# Patient Record
Sex: Female | Born: 1990 | Race: White | Hispanic: No | Marital: Single | Smoking: Former smoker
Health system: Southern US, Community
[De-identification: ages and names within clinical notes are randomized; demographics above are authoritative.]

## PROBLEM LIST (undated history)

## (undated) DIAGNOSIS — F99 Mental disorder, not otherwise specified: Secondary | ICD-10-CM

## (undated) DIAGNOSIS — Q600 Renal agenesis, unilateral: Secondary | ICD-10-CM

## (undated) DIAGNOSIS — L0292 Furuncle, unspecified: Secondary | ICD-10-CM

## (undated) DIAGNOSIS — O24419 Gestational diabetes mellitus in pregnancy, unspecified control: Secondary | ICD-10-CM

## (undated) DIAGNOSIS — R4586 Emotional lability: Secondary | ICD-10-CM

## (undated) DIAGNOSIS — N750 Cyst of Bartholin's gland: Secondary | ICD-10-CM

## (undated) HISTORY — PX: ADENOIDECTOMY: SUR15

## (undated) HISTORY — DX: Gestational diabetes mellitus in pregnancy, unspecified control: O24.419

## (undated) HISTORY — DX: Mental disorder, not otherwise specified: F99

## (undated) HISTORY — PX: OTHER SURGICAL HISTORY: SHX169

## (undated) HISTORY — DX: Renal agenesis, unilateral: Q60.0

## (undated) HISTORY — DX: Cyst of Bartholin's gland: N75.0

## (undated) HISTORY — PX: NEPHRECTOMY: SHX65

---
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
## 2009-05-04 ENCOUNTER — Emergency Department (HOSPITAL_COMMUNITY): Admission: EM | Admit: 2009-05-04 | Discharge: 2009-05-04 | Payer: Self-pay | Admitting: Emergency Medicine

## 2010-04-22 LAB — POCT I-STAT, CHEM 8
BUN: 3 mg/dL — ABNORMAL LOW (ref 6–23)
Chloride: 106 mEq/L (ref 96–112)
Creatinine, Ser: 0.7 mg/dL (ref 0.4–1.2)
Glucose, Bld: 89 mg/dL (ref 70–99)
Hemoglobin: 15 g/dL (ref 12.0–15.0)
Potassium: 3.5 mEq/L (ref 3.5–5.1)
Sodium: 142 mEq/L (ref 135–145)

## 2010-04-22 LAB — URINALYSIS, ROUTINE W REFLEX MICROSCOPIC
Ketones, ur: 15 mg/dL — AB
Nitrite: NEGATIVE
Protein, ur: 30 mg/dL — AB
Urobilinogen, UA: 1 mg/dL (ref 0.0–1.0)

## 2010-04-22 LAB — URINE MICROSCOPIC-ADD ON

## 2010-05-19 ENCOUNTER — Inpatient Hospital Stay (HOSPITAL_COMMUNITY)
Admission: AD | Admit: 2010-05-19 | Discharge: 2010-05-19 | Disposition: A | Discharge status: Home / Self Care | Payer: Self-pay | Source: Ambulatory Visit | Attending: Obstetrics & Gynecology | Admitting: Obstetrics & Gynecology

## 2010-05-19 DIAGNOSIS — N949 Unspecified condition associated with female genital organs and menstrual cycle: Secondary | ICD-10-CM

## 2010-05-19 DIAGNOSIS — N751 Abscess of Bartholin's gland: Secondary | ICD-10-CM | POA: Insufficient documentation

## 2010-05-22 LAB — CULTURE, ROUTINE-ABSCESS

## 2010-06-04 ENCOUNTER — Encounter: Payer: Self-pay | Admitting: Obstetrics and Gynecology

## 2010-06-19 NOTE — Op Note (Signed)
[place]. [facility]  Patient:    Emily David, Emily David                         MRN: [id number] Proc. Date: 10/02/99 Adm. Date:  [id number] Attending:  Carlean Purl CC:         Jamesetta Geralds, M.D.                           Operative Report  PREOPERATIVE DIAGNOSES: 1. Adenoid/tonsillar hypertrophy with obstructive symptoms. 2. Left mucoid otitis media.  POSTOPERATIVE DIAGNOSES: 1. Adenoid/tonsillar hypertrophy with obstructive symptoms. 2. Left mucoid otitis media.  OPERATION:  Tonsillectomy and adenoidectomy.  Bilateral myringotomies.  SURGEON:  Kristine Garbe. Ezzard Standing, M.D.  ANESTHESIA:  General.  COMPLICATIONS:  None.  BRIEF CLINICAL NOTE:  Emily David is a 20 year old who has had longstanding problems with snoring and obstructive breathing pattern at night.  On examination she has large 3+ tonsils, as well as large obstructing adenoid tissue.  In addition on examination in the office she had middle ear effusion on the left side.  She is taken to the operating room at this time for tonsillectomy and adenoidectomy, as well as possible myringotomy.  DESCRIPTION OF PROCEDURE:  After adequate endotracheal anesthesia, the ears were examined first.  The right TM appeared clear.  A small myringotomy was made in the inferior portion of the TM and middle ear space was dry.  On the left side a myringotomy was made in the anterior and inferior portion of the TM; a large amount of thick, mucoid fluid was aspirated from the left middle ear space.  Pediatric drops were place in the left ear canal.  This completed the ear examination and myringotomy.  Next, a mouth gag was used to expose the oropharynx.  The left and right tonsils were dissected from the tonsillar pocket using cautery.  Hemostasis was with hemicautery.  Care was taken to preserve the anterior and posterior tonsillar cove as well as the uvula.  Following this, a red rubber catheter was  passed through the nose and out the mouth to retract soft palate; nasopharynx was examined.  ______ had large obstructing adenoid tissue.  A large adenoid curet was used to remove the central pad of adenoid tissue. The nasopharyngeal packs were placed for hemostasis; these were then removed and further hemostasis was obtained with suction cautery.  After obtaining adequate hemostasis, the procedure was completed.  Nasopharynx was irrigated with saline.  Emily David was wakened from anesthesia and transferred to the recovery room; postoperatively doing well.  Of note, she received 8 mg of Decadron IV preoperatively, as well as 1 g Ancef preoperatively.  DISPOSITION:  Emily David is discharged home later this morning.  Amoxicillin suspension 400 mg b.i.d. for one week, along with Tylenol and Lortab elixir 1-2 teaspoons q.4h. p.r.n. pain.  She is to follow up in my office in two weeks for recheck. DD:  10/02/99 TD:  10/02/99 Job: [id number]

## 2011-01-09 ENCOUNTER — Emergency Department (HOSPITAL_BASED_OUTPATIENT_CLINIC_OR_DEPARTMENT_OTHER)
Admission: EM | Admit: 2011-01-09 | Discharge: 2011-01-09 | Disposition: A | Discharge status: Home / Self Care | Payer: Self-pay | Attending: Emergency Medicine | Admitting: Emergency Medicine

## 2011-01-09 ENCOUNTER — Encounter: Payer: Self-pay | Admitting: Emergency Medicine

## 2011-01-09 DIAGNOSIS — N751 Abscess of Bartholin's gland: Secondary | ICD-10-CM | POA: Insufficient documentation

## 2011-01-09 MED ORDER — MORPHINE SULFATE 2 MG/ML IJ SOLN
INTRAMUSCULAR | Status: AC
  Administered 2011-01-09: 2 mg via INTRAMUSCULAR
  Filled 2011-01-09: qty 1

## 2011-01-09 MED ORDER — HYDROCODONE-ACETAMINOPHEN 5-325 MG PO TABS: 1.0000 | ORAL_TABLET | ORAL | Status: AC | PRN

## 2011-01-09 MED ORDER — MORPHINE SULFATE 4 MG/ML IJ SOLN
4.0000 mg | Freq: Once | INTRAMUSCULAR | Status: AC
  Administered 2011-01-09: 4 mg via INTRAMUSCULAR

## 2011-01-09 MED ORDER — MORPHINE SULFATE 2 MG/ML IJ SOLN
2.0000 mg | Freq: Once | INTRAMUSCULAR | Status: AC
  Administered 2011-01-09: 2 mg via INTRAMUSCULAR

## 2011-01-09 MED ORDER — MORPHINE SULFATE 4 MG/ML IJ SOLN
6.0000 mg | Freq: Once | INTRAMUSCULAR | Status: DC
  Filled 2011-01-09: qty 1

## 2011-01-09 MED ORDER — OXYCODONE-ACETAMINOPHEN 5-325 MG PO TABS
1.0000 | ORAL_TABLET | Freq: Once | ORAL | Status: AC
  Administered 2011-01-09: 1 via ORAL
  Filled 2011-01-09: qty 1

## 2011-01-09 MED ORDER — CEPHALEXIN 500 MG PO CAPS: 500.0000 mg | ORAL_CAPSULE | Freq: Four times a day (QID) | ORAL | Status: AC

## 2011-01-09 NOTE — ED Notes (Addendum)
Abcess to right labia came about 4 days ago, painful x 1 day, pain worse with movement

## 2011-01-09 NOTE — ED Notes (Signed)
I & D tray has been placed at bedside.

## 2011-01-09 NOTE — ED Notes (Signed)
The patient is undress from the waist down. The bed is lock and in the lowest position. The call light is within reach, and a family member is sitting with the patient.

## 2011-01-09 NOTE — ED Notes (Signed)
Assisted Dr. Golda Acre with I & D.  Pt had large amount of pus and blood from incision.  Pt tolerated well.  Packing in place.  Peri-care offered.

## 2011-01-09 NOTE — ED Provider Notes (Signed)
History     CSN: [id number] Arrival date & time: 01/09/2011 11:32 AM   First MD Initiated Contact with Patient 01/09/11 1201      Chief Complaint  Patient presents with  . Recurrent Skin Infections  . Bartholin's Cyst    (Consider location/radiation/quality/duration/timing/severity/associated sxs/prior treatment) HPI Comments: Patient complains of a few days of increased swelling in her right lower vagina and labia region.  She has had a abscess there before which he did have drained.  She denies any fevers.  No nausea or vomiting or abdominal pain.  No dysuria or other vaginal discharge.  Patient has not been able to control her pain at home with over-the-counter pain medicines.  Patient is a 20 y.o. female presenting with abscess. The history is provided by the patient.  Abscess  Pertinent negatives include no fever, no diarrhea, no vomiting and no cough.    Past Medical History  Diagnosis Date  . Asthma     Past Surgical History  Procedure Date  . 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 No pertinent family history.  History  Substance Use Topics  . Smoking status: Current Everyday Smoker -- 0.5 packs/day for 1 years    Types: Cigarettes  . Smokeless tobacco: Not on file  . Alcohol Use: Yes     occausional    OB History    Grav Para Term Preterm Abortions TAB SAB Ect Mult Living                  Review of Systems  Constitutional: Negative.  Negative for fever and chills.  HENT: Negative.   Eyes: Negative.  Negative for discharge and redness.  Respiratory: Negative.  Negative for cough and shortness of breath.   Cardiovascular: Negative.  Negative for chest pain.  Gastrointestinal: Negative.  Negative for nausea, vomiting, abdominal pain and diarrhea.  Genitourinary: Positive for vaginal pain. Negative for dysuria and vaginal discharge.  Musculoskeletal: Negative.  Negative for back pain.  Skin: Negative.  Negative for color change and rash.    Neurological: Negative.  Negative for syncope and headaches.  Hematological: Negative.  Negative for adenopathy.  Psychiatric/Behavioral: Negative.  Negative for confusion.  All other systems reviewed and are negative.    Allergies  Review of patient's allergies indicates no known allergies.  Home Medications  No current outpatient prescriptions on file.  BP 150/90  Pulse 86  Temp(Src) 98.1 F (36.7 C) (Oral)  Resp 20  Ht 5\' 7"  (1.702 m)  Wt 230 lb (104.327 kg)  BMI 36.02 kg/m2  SpO2 100%  LMP 12/26/2010  Physical Exam  Constitutional: She is oriented to person, place, and time. She appears well-developed and well-nourished.  HENT:  Head: Normocephalic and atraumatic.  Eyes: Conjunctivae and EOM are normal. Pupils are equal, round, and reactive to light.  Neck: Normal range of motion. Neck supple.  Cardiovascular: Normal rate and regular rhythm.   Pulmonary/Chest: Effort normal and breath sounds normal.  Abdominal: Soft. Bowel sounds are normal. There is no tenderness.  Genitourinary:       Asencion Islam was present as a chaperone during procedure.  Patient had a right Bartholin's gland abscess.  There were 2 areas of fluctuance and swelling present no crepitus.  No significant cellulitis to the labia.  No extension to the rectum  Musculoskeletal: Normal range of motion.  Neurological: She is alert and oriented to person, place, and time.  Skin: Skin is warm and dry. No rash noted.  No erythema.  Psychiatric: She has a normal mood and affect. Her behavior is normal. Judgment and thought content normal.    ED Course  INCISION AND DRAINAGE Date/Time: 01/09/2011 1:50 PM Performed by: Emeline General A Authorized by: Emeline General A Consent: Verbal consent obtained. Written consent not obtained. Risks and benefits: risks, benefits and alternatives were discussed Consent given by: patient Patient understanding: patient states understanding of the procedure being  performed Patient identity confirmed: verbally with patient Type: abscess Location: Right Bartholin's gland abscess. Anesthesia: local infiltration Local anesthetic: lidocaine 2% with epinephrine Anesthetic total: 5 ml Patient sedated: no Scalpel size: 11 Complexity: simple Drainage: purulent Drainage amount: copious Wound treatment: drain placed Packing material: 1/2 in gauze Patient tolerance: Patient tolerated the procedure well with no immediate complications. Comments: Patient had an initial incision done marked anteriorly which did not get any pus but relieved a small area of fluctuance.  More posteriorly she had a large area of fluctuance which a 1 cm incision was placed in and did drink copious amounts of pus.  Fluctuance is now resolved.  A packing was placed as patient did not want to have another words catheter placed that caused her significant discomfort previously.   (including critical care time)  Labs Reviewed - No data to display No results found.   No diagnosis found.    MDM  Patient with Bartholin's gland abscess.  He was able to be successfully drained here patient and already feels significantly better.  Patient's been advised that she should follow up long-term with gynecology since this is the second time that this occurred to her.  [facility] does not have a gynecologist at this time.  I will place the patient on antibiotics and she's been instructed regarding soaks and only using soap and water without any fragrances or other consults.        Nat Christen, MD 01/09/11 1351

## 2011-05-26 ENCOUNTER — Encounter (HOSPITAL_COMMUNITY): Payer: Self-pay | Admitting: Emergency Medicine

## 2011-05-26 ENCOUNTER — Emergency Department (HOSPITAL_COMMUNITY)
Admission: EM | Admit: 2011-05-26 | Discharge: 2011-05-26 | Disposition: A | Discharge status: Home / Self Care | Payer: Self-pay | Attending: Emergency Medicine | Admitting: Emergency Medicine

## 2011-05-26 DIAGNOSIS — X58XXXA Exposure to other specified factors, initial encounter: Secondary | ICD-10-CM | POA: Insufficient documentation

## 2011-05-26 DIAGNOSIS — S025XXA Fracture of tooth (traumatic), initial encounter for closed fracture: Secondary | ICD-10-CM | POA: Insufficient documentation

## 2011-05-26 DIAGNOSIS — F17200 Nicotine dependence, unspecified, uncomplicated: Secondary | ICD-10-CM | POA: Insufficient documentation

## 2011-05-26 DIAGNOSIS — K029 Dental caries, unspecified: Secondary | ICD-10-CM | POA: Insufficient documentation

## 2011-05-26 DIAGNOSIS — K0889 Other specified disorders of teeth and supporting structures: Secondary | ICD-10-CM

## 2011-05-26 HISTORY — DX: Emotional lability: R45.86

## 2011-05-26 MED ORDER — HYDROCODONE-ACETAMINOPHEN 5-500 MG PO TABS: 1.0000 | ORAL_TABLET | Freq: Four times a day (QID) | ORAL | Status: AC | PRN

## 2011-05-26 MED ORDER — PENICILLIN V POTASSIUM 500 MG PO TABS: 500.0000 mg | ORAL_TABLET | Freq: Three times a day (TID) | ORAL | Status: AC

## 2011-05-26 NOTE — ED Provider Notes (Signed)
History     CSN: [id number]  Arrival date & time 05/26/11  4098   First MD Initiated Contact with Patient 05/26/11 1000      Chief Complaint  Patient presents with  . Dental Pain    (Consider location/radiation/quality/duration/timing/severity/associated sxs/prior treatment) HPI  21 year old female presents with chief complaints of dental pain. Patient states for the past week she has been having pain to her right upper molar. Onset was gradual, the pain is intermittent but getting progressively worse. She described pain as a throbbing sensation that radiates up to her right face. Pain is worsening with hot or cold liquid. States pain does not bother her teeth. She has been taking ibuprofen for pain. Patient takes 600 mg every 3 hours which has helped she knows she is taking too much. She denies fever, hearing changes, rash, sore throat, pain with jaw movement, or neck pain.   Past Medical History  Diagnosis Date  . Asthma   . Mood swings     Past Surgical History  Procedure Date  . 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  History  Substance Use Topics  . Smoking status: Current Everyday Smoker -- 0.5 packs/day for 1 years    Types: Cigarettes  . Smokeless tobacco: Not on file  . Alcohol Use: Yes     occausional    OB History    Grav Para Term Preterm Abortions TAB SAB Ect Mult Living                  Review of Systems  All other systems reviewed and are negative.    Allergies  Review of patient's allergies indicates no known allergies.  Home Medications   Current Outpatient Rx  Name Route Sig Dispense Refill  . FLUOXETINE HCL 20 MG PO CAPS Oral Take 20 mg by mouth daily.    . IBUPROFEN 200 MG PO TABS Oral Take 600 mg by mouth every 6 (six) hours as needed. For pain      BP 138/97  Pulse 67  Temp(Src) 98.2 F (36.8 C) (Oral)  Resp 16  SpO2 100%  LMP 04/25/2011  Physical Exam  Nursing note and vitals reviewed. Constitutional:  She appears well-developed and well-nourished. No distress.  HENT:  Head: Normocephalic and atraumatic.  Right Ear: External ear normal.  Left Ear: External ear normal.  Mouth/Throat: Oropharynx is clear and moist. No oropharyngeal exudate.         No evidence of TMJ. No ear involvement. No postauricular lymphadenopathy or rash  Eyes: Conjunctivae are normal.  Neck: Normal range of motion. Neck supple.  Musculoskeletal: Normal range of motion.  Lymphadenopathy:    She has no cervical adenopathy.  Neurological: She is alert.  Skin: Skin is warm. No rash noted.    ED Course  Procedures (including critical care time)  Labs Reviewed - No data to display No results found.   No diagnosis found.    MDM  Dental pain to R upper molar with decay and avulsion noted.  No abscess.  ABX and pain medication prescribed.  Recommend not to exceed recommended dosage for ibuprofen and tylenol. Pt voice understanding.  Referral to dentist given. Pt is afebrile, with stable normal VS.        Fayrene Helper, PA-C 05/26/11 1013

## 2011-05-26 NOTE — ED Notes (Signed)
Pt presenting to ed with c/o upper right toothache x 1 week. Pt is alert and oriented at this time.

## 2011-05-26 NOTE — Discharge Instructions (Signed)
Dental Pain  A tooth ache may be caused by cavities (tooth decay). Cavities expose the nerve of the tooth to air and hot or cold temperatures. It may come from an infection or abscess (also called a boil or furuncle) around your tooth. It is also often caused by dental caries (tooth decay). This causes the pain you are having.  DIAGNOSIS   Your caregiver can diagnose this problem by exam.  TREATMENT   · If caused by an infection, it may be treated with medications which kill germs (antibiotics) and pain medications as prescribed by your caregiver. Take medications as directed.  · Only take over-the-counter or prescription medicines for pain, discomfort, or fever as directed by your caregiver.  · Whether the tooth ache today is caused by infection or dental disease, you should see your dentist as soon as possible for further care.  SEEK MEDICAL CARE IF:  The exam and treatment you received today has been provided on an emergency basis only. This is not a substitute for complete medical or dental care. If your problem worsens or new problems (symptoms) appear, and you are unable to meet with your dentist, call or return to this location.  SEEK IMMEDIATE MEDICAL CARE IF:   · You have a fever.  · You develop redness and swelling of your face, jaw, or neck.  · You are unable to open your mouth.  · You have severe pain uncontrolled by pain medicine.  MAKE SURE YOU:   · Understand these instructions.  · Will watch your condition.  · Will get help right away if you are not doing well or get worse.  Document Released: 01/18/2005 Document Revised: 01/07/2011 Document Reviewed: 09/06/2007  [employer]® Patient Information ©2012 [employer].

## 2011-05-29 NOTE — ED Provider Notes (Signed)
Medical screening examination/treatment/procedure(s) were performed by non-physician practitioner and as supervising physician I was immediately available for consultation/collaboration.  Donnetta Hutching, MD 05/29/11 1622

## 2011-07-10 ENCOUNTER — Emergency Department (HOSPITAL_COMMUNITY)
Admission: EM | Admit: 2011-07-10 | Discharge: 2011-07-10 | Disposition: A | Discharge status: Home / Self Care | Payer: Self-pay | Attending: Emergency Medicine | Admitting: Emergency Medicine

## 2011-07-10 ENCOUNTER — Encounter (HOSPITAL_COMMUNITY): Payer: Self-pay

## 2011-07-10 DIAGNOSIS — K0889 Other specified disorders of teeth and supporting structures: Secondary | ICD-10-CM

## 2011-07-10 DIAGNOSIS — X58XXXA Exposure to other specified factors, initial encounter: Secondary | ICD-10-CM | POA: Insufficient documentation

## 2011-07-10 DIAGNOSIS — J45909 Unspecified asthma, uncomplicated: Secondary | ICD-10-CM | POA: Insufficient documentation

## 2011-07-10 DIAGNOSIS — F17200 Nicotine dependence, unspecified, uncomplicated: Secondary | ICD-10-CM | POA: Insufficient documentation

## 2011-07-10 DIAGNOSIS — S025XXA Fracture of tooth (traumatic), initial encounter for closed fracture: Secondary | ICD-10-CM | POA: Insufficient documentation

## 2011-07-10 MED ORDER — OXYCODONE-ACETAMINOPHEN 5-325 MG PO TABS: 2.0000 | ORAL_TABLET | ORAL | Status: AC | PRN

## 2011-07-10 NOTE — Discharge Instructions (Signed)
° °

## 2011-07-10 NOTE — ED Provider Notes (Signed)
Medical screening examination/treatment/procedure(s) were performed by non-physician practitioner and as supervising physician I was immediately available for consultation/collaboration.   Jerrianne Hartin, MD 07/10/11 1534 

## 2011-07-10 NOTE — ED Provider Notes (Signed)
History     CSN: [id number]  Arrival date & time 07/10/11  1019   First MD Initiated Contact with Patient 07/10/11 1209      Chief Complaint  Patient presents with  . Dental Pain   21 y/o female c/o exacerbation of chronic tooth pain secondary to Broken tooth x1 day. Pain to right upper jaw radiating up to temporal region.  Denies fever and chills. Pt seen for similar x2 months ago and did not follow up with dentist secondary to money concerns    (Consider location/radiation/quality/duration/timing/severity/associated sxs/prior treatment) Patient is a 21 y.o. female presenting with tooth pain. The history is provided by the patient.  Dental PainThe symptoms began 12 to 24 hours ago. The symptoms are waxing and waning.  Additional symptoms include: dental sensitivity to temperature and gum tenderness. Additional symptoms do not include: gum swelling, purulent gums, trismus, jaw pain, facial swelling, pain with swallowing and ear pain.    Past Medical History  Diagnosis Date  . Asthma   . Mood swings     Past Surgical History  Procedure Date  . 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 No pertinent family history.  History  Substance Use Topics  . Smoking status: Current Everyday Smoker -- 0.5 packs/day for 1 years    Types: Cigarettes  . Smokeless tobacco: Not on file  . Alcohol Use: Yes     occausional    OB History    Grav Para Term Preterm Abortions TAB SAB Ect Mult Living                  Review of Systems  Constitutional: Negative.   HENT: Negative for ear pain, facial swelling, neck pain and neck stiffness.     Allergies  Review of patient's allergies indicates no known allergies.  Home Medications   Current Outpatient Rx  Name Route Sig Dispense Refill  . FLUOXETINE HCL 20 MG PO CAPS Oral Take 20 mg by mouth daily.    . IBUPROFEN 200 MG PO TABS Oral Take 800 mg by mouth every 6 (six) hours as needed. For pain      BP 148/113  Pulse 111   Temp(Src) 98.1 F (36.7 C) (Oral)  Resp 16  SpO2 100%  LMP 06/02/2011  Physical Exam  Constitutional: She appears well-developed and well-nourished.  HENT:  Head: Normocephalic and atraumatic. No trismus in the jaw.  Right Ear: External ear normal.  Left Ear: External ear normal.  Mouth/Throat: Uvula is midline and oropharynx is clear and moist. No oral lesions. Abnormal dentition. No dental abscesses.    Eyes: Pupils are equal, round, and reactive to light.  Neck: Normal range of motion. Neck supple.  Cardiovascular: Normal rate, regular rhythm and normal heart sounds.   Pulmonary/Chest: Effort normal and breath sounds normal.  Abdominal: Bowel sounds are normal.  Neurological: She is alert.  Skin: Skin is warm and dry.    ED Course  Procedures (including critical care time)  Labs Reviewed - No data to display No results found.   No diagnosis found. Dental pain    MDM  No Evidence of dental abscess, Pt afebrile and VSS. Will d/c with percocet and instructions for low cost dental care        Joni Reining Casanova Schurman 07/10/11 1301

## 2011-07-10 NOTE — ED Notes (Signed)
Pt in from home with dental pain states on and off x1 month no relief with advil states broken upper right back tooth states has not seen dentist d/t lack of funds

## 2011-09-05 ENCOUNTER — Emergency Department (HOSPITAL_BASED_OUTPATIENT_CLINIC_OR_DEPARTMENT_OTHER)
Admission: EM | Admit: 2011-09-05 | Discharge: 2011-09-05 | Disposition: A | Discharge status: Home / Self Care | Payer: Self-pay | Attending: Emergency Medicine | Admitting: Emergency Medicine

## 2011-09-05 ENCOUNTER — Encounter (HOSPITAL_BASED_OUTPATIENT_CLINIC_OR_DEPARTMENT_OTHER): Payer: Self-pay | Admitting: Emergency Medicine

## 2011-09-05 DIAGNOSIS — N390 Urinary tract infection, site not specified: Secondary | ICD-10-CM | POA: Insufficient documentation

## 2011-09-05 DIAGNOSIS — F17200 Nicotine dependence, unspecified, uncomplicated: Secondary | ICD-10-CM | POA: Insufficient documentation

## 2011-09-05 DIAGNOSIS — N750 Cyst of Bartholin's gland: Secondary | ICD-10-CM | POA: Insufficient documentation

## 2011-09-05 HISTORY — DX: Furuncle, unspecified: L02.92

## 2011-09-05 LAB — URINALYSIS, ROUTINE W REFLEX MICROSCOPIC
Ketones, ur: 15 mg/dL — AB
Nitrite: NEGATIVE
Protein, ur: 30 mg/dL — AB
pH: 6.5 (ref 5.0–8.0)

## 2011-09-05 LAB — URINE MICROSCOPIC-ADD ON

## 2011-09-05 MED ORDER — CEPHALEXIN 500 MG PO CAPS: 500.0000 mg | ORAL_CAPSULE | Freq: Four times a day (QID) | ORAL | Status: AC

## 2011-09-05 MED ORDER — LIDOCAINE HCL 2 % IJ SOLN
INTRAMUSCULAR | Status: AC
  Administered 2011-09-05: 15:00:00
  Filled 2011-09-05: qty 1

## 2011-09-05 MED ORDER — MORPHINE SULFATE 4 MG/ML IJ SOLN
4.0000 mg | Freq: Once | INTRAMUSCULAR | Status: AC
  Administered 2011-09-05: 4 mg via INTRAMUSCULAR
  Filled 2011-09-05: qty 1

## 2011-09-05 MED ORDER — HYDROCODONE-ACETAMINOPHEN 5-500 MG PO TABS: 1.0000 | ORAL_TABLET | Freq: Four times a day (QID) | ORAL | Status: AC | PRN

## 2011-09-05 NOTE — ED Provider Notes (Signed)
History     CSN: [id number]  Arrival date & time 09/05/11  1309   First MD Initiated Contact with Patient 09/05/11 1322      Chief Complaint  Patient presents with  . Recurrent Skin Infections  . Boil     (Consider location/radiation/quality/duration/timing/severity/associated sxs/prior treatment) HPI Comments: Pt states that she has a history of them:pt has not had follow up with [facility] and swelling to the right vagina  Patient is a 21 y.o. female presenting with abscess. The history is provided by the patient. No language interpreter was used.  Abscess  This is a recurrent problem. The current episode started yesterday. The problem occurs continuously. The problem has been unchanged. The problem is moderate. The abscess is characterized by painfulness and swelling.    Past Medical History  Diagnosis Date  . Asthma   . Mood swings   . Boil     Past Surgical History  Procedure Date  . 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  History  Substance Use Topics  . Smoking status: Current Everyday Smoker -- 0.5 packs/day for 1 years    Types: Cigarettes  . Smokeless tobacco: Not on file  . Alcohol Use: Yes     occausional    OB History    Grav Para Term Preterm Abortions TAB SAB Ect Mult Living                  Review of Systems  Constitutional: Negative.   Respiratory: Negative.   Cardiovascular: Negative.     Allergies  Review of patient's allergies indicates no known allergies.  Home Medications   Current Outpatient Rx  Name Route Sig Dispense Refill  . ACETAMINOPHEN 500 MG PO TABS Oral Take 1,000 mg by mouth every 6 (six) hours as needed.    Marland Kitchen FLUOXETINE HCL 20 MG PO CAPS Oral Take 20 mg by mouth daily.    . IBUPROFEN 200 MG PO TABS Oral Take 600 mg by mouth every 6 (six) hours as needed. For pain      BP 128/84  Pulse 89  Temp 98.6 F (37 C) (Oral)  Resp 16  Ht 5\' 7"  (1.702 m)  Wt 240 lb (108.863 kg)  BMI 37.59 kg/m2  SpO2  100%  LMP 08/30/2011  Physical Exam  Nursing note and vitals reviewed. Constitutional: She is oriented to person, place, and time. She appears well-developed and well-nourished.  Cardiovascular: Normal rate and regular rhythm.   Pulmonary/Chest: Effort normal and breath sounds normal.  Genitourinary:       Pt has bartholins abscess  Musculoskeletal: Normal range of motion.  Neurological: She is alert and oriented to person, place, and time.  Skin: Skin is warm and dry.  Psychiatric: She has a normal mood and affect.    ED Course  INCISION AND DRAINAGE Performed by: Teressa Lower Authorized by: Teressa Lower Consent: Verbal consent obtained. Written consent not obtained. Risks and benefits: risks, benefits and alternatives were discussed Consent given by: patient Patient identity confirmed: verbally with patient Time out: Immediately prior to procedure a "time out" was called to verify the correct patient, procedure, equipment, support staff and site/side marked as required. Type: abscess Body area: anogenital Location details: Bartholin's gland Local anesthetic: lidocaine 2% without epinephrine Drainage: purulent Drainage amount: moderate Wound treatment: drain placed Patient tolerance: Patient tolerated the procedure well with no immediate complications.   (including critical care time)  Labs Reviewed  URINALYSIS,  ROUTINE W REFLEX MICROSCOPIC - Abnormal; Notable for the following:    Color, Urine AMBER (*)  BIOCHEMICALS MAY BE AFFECTED BY COLOR   APPearance TURBID (*)     Specific Gravity, Urine 1.045 (*)     Bilirubin Urine SMALL (*)     Ketones, ur 15 (*)     Protein, ur 30 (*)     Leukocytes, UA MODERATE (*)     All other components within normal limits  URINE MICROSCOPIC-ADD ON - Abnormal; Notable for the following:    Squamous Epithelial / LPF MANY (*)     Bacteria, UA MANY (*)     All other components within normal limits  PREGNANCY, URINE   No  results found.   1. UTI (lower urinary tract infection)   2. Bartholin cyst       MDM  Drain placed:pt instructed on follow up        Teressa Lower, NP 09/05/11 1515

## 2011-09-05 NOTE — ED Notes (Signed)
Pt states she has a boil to right side of the opening of the vagina.  Pt states she has had them before.  Some drainage.  No known fever.

## 2011-09-05 NOTE — ED Provider Notes (Signed)
Medical screening examination/treatment/procedure(s) were performed by non-physician practitioner and as supervising physician I was immediately available for consultation/collaboration.  Sarahi Borland, MD 09/05/11 1531 

## 2012-06-07 ENCOUNTER — Encounter (HOSPITAL_COMMUNITY): Payer: Self-pay

## 2012-06-07 ENCOUNTER — Inpatient Hospital Stay (HOSPITAL_COMMUNITY)
Admission: AD | Admit: 2012-06-07 | Discharge: 2012-06-07 | Disposition: A | Discharge status: Home / Self Care | Payer: Medicaid Other | Source: Ambulatory Visit | Attending: Obstetrics & Gynecology | Admitting: Obstetrics & Gynecology

## 2012-06-07 DIAGNOSIS — R109 Unspecified abdominal pain: Secondary | ICD-10-CM | POA: Insufficient documentation

## 2012-06-07 DIAGNOSIS — N949 Unspecified condition associated with female genital organs and menstrual cycle: Secondary | ICD-10-CM | POA: Insufficient documentation

## 2012-06-07 DIAGNOSIS — Z3202 Encounter for pregnancy test, result negative: Secondary | ICD-10-CM | POA: Insufficient documentation

## 2012-06-07 DIAGNOSIS — N912 Amenorrhea, unspecified: Secondary | ICD-10-CM | POA: Insufficient documentation

## 2012-06-07 DIAGNOSIS — K219 Gastro-esophageal reflux disease without esophagitis: Secondary | ICD-10-CM | POA: Insufficient documentation

## 2012-06-07 LAB — CBC
HCT: 39.7 % (ref 36.0–46.0)
Hemoglobin: 13.3 g/dL (ref 12.0–15.0)
MCH: 28.5 pg (ref 26.0–34.0)
MCHC: 33.5 g/dL (ref 30.0–36.0)
MCV: 85.2 fL (ref 78.0–100.0)

## 2012-06-07 LAB — BASIC METABOLIC PANEL
BUN: 10 mg/dL (ref 6–23)
Creatinine, Ser: 0.74 mg/dL (ref 0.50–1.10)
GFR calc non Af Amer: >90 mL/min (ref >90)
Glucose, Bld: 93 mg/dL (ref 70–99)
Potassium: 4.4 mEq/L (ref 3.5–5.1)

## 2012-06-07 LAB — URINALYSIS, ROUTINE W REFLEX MICROSCOPIC
Glucose, UA: NEGATIVE mg/dL
Leukocytes, UA: NEGATIVE
Protein, ur: NEGATIVE mg/dL
Specific Gravity, Urine: >1.03 — ABNORMAL HIGH (ref 1.005–1.030)
Urobilinogen, UA: 0.2 mg/dL (ref 0.0–1.0)

## 2012-06-07 LAB — WET PREP, GENITAL: Clue Cells Wet Prep HPF POC: NONE SEEN

## 2012-06-07 LAB — HEPATIC FUNCTION PANEL
Albumin: 4 g/dL (ref 3.5–5.2)
Alkaline Phosphatase: 92 U/L (ref 39–117)
Bilirubin, Direct: <0.1 mg/dL (ref 0.0–0.3)
Total Bilirubin: 0.2 mg/dL — ABNORMAL LOW (ref 0.3–1.2)

## 2012-06-07 LAB — POCT PREGNANCY, URINE: Preg Test, Ur: NEGATIVE

## 2012-06-07 MED ORDER — FAMOTIDINE 20 MG PO TABS: 20.0000 mg | ORAL_TABLET | Freq: Two times a day (BID) | ORAL | Status: DC

## 2012-06-07 MED ORDER — FAMOTIDINE 20 MG PO TABS
20.0000 mg | ORAL_TABLET | Freq: Once | ORAL | Status: AC
  Administered 2012-06-07: 20 mg via ORAL
  Filled 2012-06-07: qty 1

## 2012-06-07 NOTE — MAU Note (Signed)
Patient states that she has missed her period for 2 months and has had negative pregnancy tests at home. Has had mid to upper abdominal pain for 3 days that can cause her to be nauseated. Denies bleeding but has a heavier than usual discharge.

## 2012-06-07 NOTE — MAU Provider Note (Signed)
History     CSN: [id number]  Arrival date and time: 06/07/12 1410   First Provider Initiated Contact with Patient 06/07/12 1544      Chief Complaint  Patient presents with  . Possible Pregnancy  . Abdominal Pain  . Vaginal Discharge  . Nausea   HPI [name] is a 22 y.o. G0P0 who presents to MAU today with complaint of amenorrhea, abdominal pain, nausea and vaginal discharge. The patient states LMP was 04/01/12. She has been having upper abdominal cramping x 3-4 days. It is not associated with food. She denies heartburn. She has had occasional nausea without vomiting. She has noticed an increase in vaginal discharge lately. It is white and clear without odor, itching or irritation.   OB History   Grav Para Term Preterm Abortions TAB SAB Ect Mult Living   0               Past Medical History  Diagnosis Date  . Asthma   . Mood swings   . Boil     Past Surgical History  Procedure Laterality Date  . 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 No pertinent family history.  History  Substance Use Topics  . Smoking status: Current Every Day Smoker -- 0.50 packs/day for 1 years    Types: Cigarettes  . Smokeless tobacco: Not on file  . Alcohol Use: Yes     Comment: occausional    Allergies: No Known Allergies  Prescriptions prior to admission  Medication Sig Dispense Refill  . acetaminophen (TYLENOL) 500 MG tablet Take 1,000 mg by mouth every 6 (six) hours as needed. For pain.      Marland Kitchen FLUoxetine (PROZAC) 20 MG capsule Take 20 mg by mouth daily.      . [DISCONTINUED] ibuprofen (ADVIL,MOTRIN) 200 MG tablet Take 600 mg by mouth every 6 (six) hours as needed. For pain        Review of Systems  Constitutional: Negative for fever and malaise/fatigue.  Gastrointestinal: Positive for nausea and abdominal pain. Negative for vomiting, diarrhea and constipation.  Genitourinary: Negative for dysuria, urgency and frequency.       Neg - vaginal bleeding + vaginal  discharge   Physical Exam   Blood pressure 130/83, pulse 68, temperature 98.8 F (37.1 C), temperature source Oral, resp. rate 16, height 5\' 6"  (1.676 m), weight 235 lb 12.8 oz (106.958 kg), last menstrual period 04/01/2012, SpO2 100.00%.  Physical Exam  Constitutional: She is oriented to person, place, and time. She appears well-developed and well-nourished. No distress.  HENT:  Head: Normocephalic and atraumatic.  Cardiovascular: Normal rate.   Respiratory: Effort normal.  GI: Soft. Bowel sounds are normal. She exhibits no distension and no mass. There is tenderness (mild to moderate tenderness to palpation of the epigastric region just below the xyphoid process). There is no rebound and no guarding.  Genitourinary: Uterus is not enlarged and not tender. Cervix exhibits no motion tenderness, no discharge and no friability. Right adnexum displays no mass and no tenderness. Left adnexum displays no mass and no tenderness. Vaginal discharge (small amount of thin, white, mucus discharge noted) found.  Neurological: She is alert and oriented to person, place, and time.  Skin: Skin is warm and dry. No erythema.  Psychiatric: She has a normal mood and affect.   Results for orders placed during the hospital encounter of 06/07/12 (from the past 24 hour(s))  URINALYSIS, ROUTINE W REFLEX MICROSCOPIC  Status: Abnormal   Collection Time    06/07/12  2:40 PM      Result Value Range   Color, Urine YELLOW  YELLOW   APPearance CLEAR  CLEAR   Specific Gravity, Urine >1.030 (*) 1.005 - 1.030   pH 6.0  5.0 - 8.0   Glucose, UA NEGATIVE  NEGATIVE mg/dL   Hgb urine dipstick NEGATIVE  NEGATIVE   Bilirubin Urine NEGATIVE  NEGATIVE   Ketones, ur NEGATIVE  NEGATIVE mg/dL   Protein, ur NEGATIVE  NEGATIVE mg/dL   Urobilinogen, UA 0.2  0.0 - 1.0 mg/dL   Nitrite NEGATIVE  NEGATIVE   Leukocytes, UA NEGATIVE  NEGATIVE  POCT PREGNANCY, URINE     Status: None   Collection Time    06/07/12  2:42 PM       Result Value Range   Preg Test, Ur NEGATIVE  NEGATIVE  WET PREP, GENITAL     Status: Abnormal   Collection Time    06/07/12  3:40 PM      Result Value Range   Yeast Wet Prep HPF POC NONE SEEN  NONE SEEN   Trich, Wet Prep NONE SEEN  NONE SEEN   Clue Cells Wet Prep HPF POC NONE SEEN  NONE SEEN   WBC, Wet Prep HPF POC FEW (*) NONE SEEN  CBC     Status: Abnormal   Collection Time    06/07/12  3:50 PM      Result Value Range   WBC 12.0 (*) 4.0 - 10.5 K/uL   RBC 4.66  3.87 - 5.11 MIL/uL   Hemoglobin 13.3  12.0 - 15.0 g/dL   HCT 16.1  09.6 - 04.5 %   MCV 85.2  78.0 - 100.0 fL   MCH 28.5  26.0 - 34.0 pg   MCHC 33.5  30.0 - 36.0 g/dL   RDW 40.9  81.1 - 91.4 %   Platelets 252  150 - 400 K/uL  BASIC METABOLIC PANEL     Status: None   Collection Time    06/07/12  3:50 PM      Result Value Range   Sodium 137  135 - 145 mEq/L   Potassium 4.4  3.5 - 5.1 mEq/L   Chloride 100  96 - 112 mEq/L   CO2 28  19 - 32 mEq/L   Glucose, Bld 93  70 - 99 mg/dL   BUN 10  6 - 23 mg/dL   Creatinine, Ser 7.82  0.50 - 1.10 mg/dL   Calcium 95.6  8.4 - 21.3 mg/dL   GFR calc non Af Amer >90  >90 mL/min   GFR calc Af Amer >90  >90 mL/min  HCG, QUANTITATIVE, PREGNANCY     Status: None   Collection Time    06/07/12  3:50 PM      Result Value Range   hCG, Beta Chain, Quant, S <1  <5 mIU/mL  HEPATIC FUNCTION PANEL     Status: Abnormal   Collection Time    06/07/12  3:50 PM      Result Value Range   Total Protein 7.4  6.0 - 8.3 g/dL   Albumin 4.0  3.5 - 5.2 g/dL   AST 16  0 - 37 U/L   ALT 21  0 - 35 U/L   Alkaline Phosphatase 92  39 - 117 U/L   Total Bilirubin 0.2 (*) 0.3 - 1.2 mg/dL   Bilirubin, Direct <0.8  0.0 - 0.3 mg/dL   Indirect Bilirubin NOT  CALCULATED  0.3 - 0.9 mg/dL    MAU Course  Procedures None  MDM CBC, BMP, UA, Wet prep and GC/Chlamydia today Pepcid given in MAU - patient report significant improvement in symptoms  Assessment and Plan  A: GERD Amenorrhea  P: Discharge  home Rx for pepcid sent to patient's pharmacy Patient given GERD diet on AVS Patient instructed to take another HPT in ~ 1 week Patient will follow-up at [facility] clinic in 3-4 weeks for further work-up of amenorrhea Patient may return to MAU as needed or if her condition were to change or worsen  Freddi Starr, PA-C  06/07/2012, 5:35 PM

## 2012-07-05 ENCOUNTER — Ambulatory Visit (INDEPENDENT_AMBULATORY_CARE_PROVIDER_SITE_OTHER): Payer: Self-pay | Admitting: Advanced Practice Midwife

## 2012-07-05 ENCOUNTER — Encounter: Payer: Self-pay | Admitting: Advanced Practice Midwife

## 2012-07-05 VITALS — BP 115/79 | HR 98 | Ht 67.0 in | Wt 238.2 lb

## 2012-07-05 DIAGNOSIS — N926 Irregular menstruation, unspecified: Secondary | ICD-10-CM

## 2012-07-05 NOTE — Progress Notes (Signed)
S: This a 22yo with c/o irregular periods x 3 months and right sided pelvic pain. Pt states that she did have a period in May which lasted about 2 1/2 days and was heavy. She says prior to this time she has had a period every month. She is currently sexually active and uses no consistent birth control method. S he states she has never been pregnant. Last pregnancy test 2 weeks ago which was negative. The right sided lower quadrant pain is intermittent but sharp when she has it. Last experienced 2 days ago and she states she was unable to have sex due to the pain. She suspects that she has PCOS based on symptoms her neighbor was telling her about. 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  O:BP 115/79, P98, WT 238lb, Ht 5'7"     HEENT: Ezcematous rash on neck, skin tags noted     Thyroid:WNL      Breasts: no masses, non tender      Heart: NL RRR, no murmurs      Lungs: CTA      Abd: Obese, slight RUQ pain with deep palpation , + BS      Pelvic: Vulva: WNL                  Vagina: Small Rt sided Bartholins forming, neg discharge                   Cervix: no lesion                  Uterus: NSSC, NT                  Adnexae: Rt sided tenderness but no masses detested A: Suspect PCOS P: Suggested when finances allow get PCOS lab w/u      Make LS changes re wt. Loss and exercise      Rx for Ocella 1 tab daily      RTC in one month    .I have seen the patient with the resident/student and agree with the above.  Tawnya Crook

## 2012-08-03 ENCOUNTER — Inpatient Hospital Stay (HOSPITAL_COMMUNITY)
Admission: AD | Admit: 2012-08-03 | Discharge: 2012-08-03 | Discharge status: Against Medical Advice | Payer: Self-pay | Source: Ambulatory Visit | Attending: Obstetrics & Gynecology | Admitting: Obstetrics & Gynecology

## 2012-08-03 NOTE — MAU Note (Signed)
Patient is not in the lobby when called to triage.  

## 2012-08-03 NOTE — MAU Note (Signed)
Patient is not in the lobby  when called to triage. Admissions states she was not in the lobby when she was called to register,

## 2012-08-07 ENCOUNTER — Other Ambulatory Visit (INDEPENDENT_AMBULATORY_CARE_PROVIDER_SITE_OTHER): Payer: Self-pay | Admitting: Obstetrics and Gynecology

## 2012-08-07 ENCOUNTER — Encounter: Payer: Self-pay | Admitting: Obstetrics and Gynecology

## 2012-08-07 ENCOUNTER — Ambulatory Visit (INDEPENDENT_AMBULATORY_CARE_PROVIDER_SITE_OTHER): Payer: Self-pay | Admitting: Obstetrics and Gynecology

## 2012-08-07 VITALS — BP 132/86 | Temp 97.2°F | Wt 237.0 lb

## 2012-08-07 DIAGNOSIS — Z3201 Encounter for pregnancy test, result positive: Secondary | ICD-10-CM

## 2012-08-07 DIAGNOSIS — O3680X9 Pregnancy with inconclusive fetal viability, other fetus: Secondary | ICD-10-CM

## 2012-08-07 LAB — POCT PREGNANCY, URINE: Preg Test, Ur: POSITIVE — AB

## 2012-08-07 LAB — OB RESULTS CONSOLE GC/CHLAMYDIA: Gonorrhea: NEGATIVE

## 2012-08-07 NOTE — Addendum Note (Signed)
Addended by: Franchot Mimes on: 08/07/2012 02:02 PM   Modules accepted: Orders

## 2012-08-07 NOTE — Progress Notes (Signed)
Pulse 101 Edema trace in feet. C/o of increasing cramping of pelvic

## 2012-08-08 LAB — OBSTETRIC PANEL
Antibody Screen: NEGATIVE
Basophils Absolute: 0 10*3/uL (ref 0.0–0.1)
Basophils Relative: 0 % (ref 0–1)
Eosinophils Relative: 1 % (ref 0–5)
HCT: 37.1 % (ref 36.0–46.0)
MCHC: 34 g/dL (ref 30.0–36.0)
MCV: 82.1 fL (ref 78.0–100.0)
Monocytes Absolute: 0.5 10*3/uL (ref 0.1–1.0)
Neutro Abs: 9.9 10*3/uL — ABNORMAL HIGH (ref 1.7–7.7)
RDW: 14.3 % (ref 11.5–15.5)

## 2012-08-08 LAB — HIV ANTIBODY (ROUTINE TESTING W REFLEX): HIV: NONREACTIVE

## 2012-08-17 ENCOUNTER — Ambulatory Visit (HOSPITAL_COMMUNITY)
Admission: RE | Admit: 2012-08-17 | Discharge: 2012-08-17 | Disposition: A | Discharge status: Home / Self Care | Payer: Medicaid Other | Source: Ambulatory Visit | Attending: Family Medicine | Admitting: Family Medicine

## 2012-08-17 DIAGNOSIS — Z3689 Encounter for other specified antenatal screening: Secondary | ICD-10-CM | POA: Insufficient documentation

## 2012-08-17 DIAGNOSIS — O3680X9 Pregnancy with inconclusive fetal viability, other fetus: Secondary | ICD-10-CM

## 2012-08-17 DIAGNOSIS — O3680X0 Pregnancy with inconclusive fetal viability, not applicable or unspecified: Secondary | ICD-10-CM | POA: Insufficient documentation

## 2012-08-28 ENCOUNTER — Other Ambulatory Visit (HOSPITAL_COMMUNITY)
Admission: RE | Admit: 2012-08-28 | Discharge: 2012-08-28 | Disposition: A | Discharge status: Home / Self Care | Payer: Medicaid Other | Source: Ambulatory Visit | Attending: Obstetrics and Gynecology | Admitting: Obstetrics and Gynecology

## 2012-08-28 ENCOUNTER — Ambulatory Visit (INDEPENDENT_AMBULATORY_CARE_PROVIDER_SITE_OTHER): Payer: Medicaid Other | Admitting: Obstetrics and Gynecology

## 2012-08-28 ENCOUNTER — Encounter: Payer: Self-pay | Admitting: Obstetrics and Gynecology

## 2012-08-28 VITALS — BP 142/90 | Temp 97.5°F | Wt 242.3 lb

## 2012-08-28 DIAGNOSIS — B354 Tinea corporis: Secondary | ICD-10-CM

## 2012-08-28 DIAGNOSIS — Z3491 Encounter for supervision of normal pregnancy, unspecified, first trimester: Secondary | ICD-10-CM | POA: Insufficient documentation

## 2012-08-28 DIAGNOSIS — Z01419 Encounter for gynecological examination (general) (routine) without abnormal findings: Secondary | ICD-10-CM | POA: Insufficient documentation

## 2012-08-28 DIAGNOSIS — O99340 Other mental disorders complicating pregnancy, unspecified trimester: Secondary | ICD-10-CM

## 2012-08-28 DIAGNOSIS — F319 Bipolar disorder, unspecified: Secondary | ICD-10-CM

## 2012-08-28 DIAGNOSIS — Z113 Encounter for screening for infections with a predominantly sexual mode of transmission: Secondary | ICD-10-CM | POA: Insufficient documentation

## 2012-08-28 LAB — POCT URINALYSIS DIP (DEVICE)
Bilirubin Urine: NEGATIVE
Ketones, ur: NEGATIVE mg/dL

## 2012-08-28 MED ORDER — TERBINAFINE HCL 1 % EX CREA: TOPICAL_CREAM | Freq: Two times a day (BID) | CUTANEOUS | Status: DC

## 2012-08-28 NOTE — Progress Notes (Signed)
Pulse- 108  Pain- ligament  . Chart reviewed, patient examined by me and I agree with management and plan of Dr. Maggie Font.

## 2012-08-28 NOTE — Patient Instructions (Signed)
Pregnancy - First Trimester  During sexual intercourse, millions of sperm go into the vagina. Only 1 sperm will penetrate and fertilize the female egg while it is in the Fallopian tube. One week later, the fertilized egg implants into the wall of the uterus. An embryo begins to develop into a baby. At 6 to 8 weeks, the eyes and face are formed and the heartbeat can be seen on ultrasound. At the end of 12 weeks (first trimester), all the baby's organs are formed. Now that you are pregnant, you will want to do everything you can to have a healthy baby. Two of the most important things are to get good prenatal care and follow your caregiver's instructions. Prenatal care is all the medical care you receive before the baby's birth. It is given to prevent, find, and treat problems during the pregnancy and childbirth.  PRENATAL EXAMS  · During prenatal visits, your weight, blood pressure, and urine are checked. This is done to make sure you are healthy and progressing normally during the pregnancy.  · A pregnant woman should gain 25 to 35 pounds during the pregnancy. However, if you are overweight or underweight, your caregiver will advise you regarding your weight.  · Your caregiver will ask and answer questions for you.  · Blood work, cervical cultures, other necessary tests, and a Pap test are done during your prenatal exams. These tests are done to check on your health and the probable health of your baby. Tests are strongly recommended and done for HIV with your permission. This is the virus that causes AIDS. These tests are done because medicines can be given to help prevent your baby from being born with this infection should you have been infected without knowing it. Blood work is also used to find out your blood type, previous infections, and follow your blood levels (hemoglobin).  · Low hemoglobin (anemia) is common during pregnancy. Iron and vitamins are given to help prevent this. Later in the pregnancy, blood  tests for diabetes will be done along with any other tests if any problems develop.  · You may need other tests to make sure you and the baby are doing well.  CHANGES DURING THE FIRST TRIMESTER   Your body goes through many changes during pregnancy. They vary from person to person. Talk to your caregiver about changes you notice and are concerned about. Changes can include:  · Your menstrual period stops.  · The egg and sperm carry the genes that determine what you look like. Genes from you and your partner are forming a baby. The female genes determine whether the baby is a boy or a girl.  · Your body increases in girth and you may feel bloated.  · Feeling sick to your stomach (nauseous) and throwing up (vomiting). If the vomiting is uncontrollable, call your caregiver.  · Your breasts will begin to enlarge and become tender.  · Your nipples may stick out more and become darker.  · The need to urinate more. Painful urination may mean you have a bladder infection.  · Tiring easily.  · Loss of appetite.  · Cravings for certain kinds of food.  · At first, you may gain or lose a couple of pounds.  · You may have changes in your emotions from day to day (excited to be pregnant or concerned something may go wrong with the pregnancy and baby).  · You may have more vivid and strange dreams.  HOME CARE INSTRUCTIONS   ·   It is very important to avoid all smoking, alcohol and non-prescribed drugs during your pregnancy. These affect the formation and growth of the baby. Avoid chemicals while pregnant to ensure the delivery of a healthy infant.  · Start your prenatal visits by the 12th week of pregnancy. They are usually scheduled monthly at first, then more often in the last 2 months before delivery. Keep your caregiver's appointments. Follow your caregiver's instructions regarding medicine use, blood and lab tests, exercise, and diet.  · During pregnancy, you are providing food for you and your baby. Eat regular, well-balanced  meals. Choose foods such as meat, fish, milk and other low fat dairy products, vegetables, fruits, and whole-grain breads and cereals. Your caregiver will tell you of the ideal weight gain.  · You can help morning sickness by keeping soda crackers at the bedside. Eat a couple before arising in the morning. You may want to use the crackers without salt on them.  · Eating 4 to 5 small meals rather than 3 large meals a day also may help the nausea and vomiting.  · Drinking liquids between meals instead of during meals also seems to help nausea and vomiting.  · A physical sexual relationship may be continued throughout pregnancy if there are no other problems. Problems may be early (premature) leaking of amniotic fluid from the membranes, vaginal bleeding, or belly (abdominal) pain.  · Exercise regularly if there are no restrictions. Check with your caregiver or physical therapist if you are unsure of the safety of some of your exercises. Greater weight gain will occur in the last 2 trimesters of pregnancy. Exercising will help:  · Control your weight.  · Keep you in shape.  · Prepare you for labor and delivery.  · Help you lose your pregnancy weight after you deliver your baby.  · Wear a good support or jogging bra for breast tenderness during pregnancy. This may help if worn during sleep too.  · Ask when prenatal classes are available. Begin classes when they are offered.  · Do not use hot tubs, steam rooms, or saunas.  · Wear your seat belt when driving. This protects you and your baby if you are in an accident.  · Avoid raw meat, uncooked cheese, cat litter boxes, and soil used by cats throughout the pregnancy. These carry germs that can cause birth defects in the baby.  · The first trimester is a good time to visit your dentist for your dental health. Getting your teeth cleaned is okay. Use a softer toothbrush and brush gently during pregnancy.  · Ask for help if you have financial, counseling, or nutritional needs  during pregnancy. Your caregiver will be able to offer counseling for these needs as well as refer you for other special needs.  · Do not take any medicines or herbs unless told by your caregiver.  · Inform your caregiver if there is any mental or physical domestic violence.  · Make a list of emergency phone numbers of family, friends, hospital, and police and fire departments.  · Write down your questions. Take them to your prenatal visit.  · Do not douche.  · Do not cross your legs.  · If you have to stand for long periods of time, rotate you feet or take small steps in a circle.  · You may have more vaginal secretions that may require a sanitary pad. Do not use tampons or scented sanitary pads.  MEDICINES AND DRUG USE IN PREGNANCY  ·   Take prenatal vitamins as directed. The vitamin should contain 1 milligram of folic acid. Keep all vitamins out of reach of children. Only a couple vitamins or tablets containing iron may be fatal to a baby or young child when ingested.  · Avoid use of all medicines, including herbs, over-the-counter medicines, not prescribed or suggested by your caregiver. Only take over-the-counter or prescription medicines for pain, discomfort, or fever as directed by your caregiver. Do not use aspirin, ibuprofen, or naproxen unless directed by your caregiver.  · Let your caregiver also know about herbs you may be using.  · Alcohol is related to a number of birth defects. This includes fetal alcohol syndrome. All alcohol, in any form, should be avoided completely. Smoking will cause low birth rate and premature babies.  · Street or illegal drugs are very harmful to the baby. They are absolutely forbidden. A baby born to an addicted mother will be addicted at birth. The baby will go through the same withdrawal an adult does.  · Let your caregiver know about any medicines that you have to take and for what reason you take them.  SEEK MEDICAL CARE IF:   You have any concerns or worries during your  pregnancy. It is better to call with your questions if you feel they cannot wait, rather than worry about them.  SEEK IMMEDIATE MEDICAL CARE IF:   · An unexplained oral temperature above 102° F (38.9° C) develops, or as your caregiver suggests.  · You have leaking of fluid from the vagina (birth canal). If leaking membranes are suspected, take your temperature and inform your caregiver of this when you call.  · There is vaginal spotting or bleeding. Notify your caregiver of the amount and how many pads are used.  · You develop a bad smelling vaginal discharge with a change in the color.  · You continue to feel sick to your stomach (nauseated) and have no relief from remedies suggested. You vomit blood or coffee ground-like materials.  · You lose more than 2 pounds of weight in 1 week.  · You gain more than 2 pounds of weight in 1 week and you notice swelling of your face, hands, feet, or legs.  · You gain 5 pounds or more in 1 week (even if you do not have swelling of your hands, face, legs, or feet).  · You get exposed to German measles and have never had them.  · You are exposed to fifth disease or chickenpox.  · You develop belly (abdominal) pain. Round ligament discomfort is a common non-cancerous (benign) cause of abdominal pain in pregnancy. Your caregiver still must evaluate this.  · You develop headache, fever, diarrhea, pain with urination, or shortness of breath.  · You fall or are in a car accident or have any kind of trauma.  · There is mental or physical violence in your home.  Document Released: 01/12/2001 Document Revised: 10/13/2011 Document Reviewed: 07/16/2008  [employer]® Patient Information ©2014 [employer].

## 2012-08-28 NOTE — Progress Notes (Signed)
Subjective:    Emily David is being seen today for her first obstetrical visit.  This is not a planned pregnancy. She is at [redacted]w[redacted]d gestation. Her obstetrical history is significant for obesity and bipolar. Relationship with FOB: significant other, not living together. Patient does intend to breast feed. Pregnancy history fully reviewed.  Menstrual History: OB History   Grav Para Term Preterm Abortions TAB SAB Ect Mult Living   1               Menarche age: 45  Patient's last menstrual period was 06/21/2012.    The following portions of the patient's history were reviewed and updated as appropriate: allergies, current medications, past family history, past medical history, past social history, past surgical history and problem list.  Review of Systems A comprehensive review of systems was negative.  Pt complains of early morning nausea with emesis. Pt is able to tolerate PO after mornings. No light headedness or palpatations   Objective:    BP 142/90  Temp(Src) 97.5 F (36.4 C)  Wt 242 lb 4.8 oz (109.907 kg)  BMI 37.94 kg/m2  LMP 06/21/2012 General appearance: alert, cooperative and no distress Head: Normocephalic, without obvious abnormality, atraumatic Throat: lips, mucosa, and tongue normal; teeth and gums normal Lungs: clear to auscultation bilaterally and normal percussion bilaterally Heart: regular rate and rhythm, S1, S2 normal, no murmur, click, rub or gallop Abdomen: soft, non-tender; bowel sounds normal; no masses,  no organomegaly Pelvic: cervix normal in appearance, external genitalia normal, no adnexal masses or tenderness, no cervical motion tenderness, rectovaginal septum normal, uterus normal size, shape, and consistency and vagina normal without discharge Extremities: extremities normal, atraumatic, no cyanosis or edema   skin: pt with dry raised erythematous plaques on neck.  Assessment:   Emily David is a 22 y.o. G1P0 at [redacted]w[redacted]d     Plan:  #Tinea corporis:  Treat with terbinafine. reeval at f/u #Bipolar: Instructed to call pscyhologist and consider continued referral. Currently off meds and stable.  PN Plan  Initial labs drawn reveiwed - no abnl Prenatal vitamins. Problem list reviewed and updated. AFP3 discussed: requested. Role of ultrasound in pregnancy discussed; fetal survey: requested. Amniocentesis discussed: not indicated. Follow up in 4 weeks.  Emily David is a 22 y.o. G1P0 at [redacted]w[redacted]d  here for ROB visit.  Discussed with Patient:  - New OB labs from last visit were wnl. - RTC for any VB, regular, painful cramps/ctxs occurring at a rate of >2/10 min, fever (100.5 or higher), n/v/d, any pain that is unresolving or worsening. - Routine precautions(SAB, depression, infection s/s) - RTC in 4 weeks for next appt.  To Do: 1. Pap smear  [ ]  Vaccines: Flu:  Tdap:  [ ]  BCM:   Edu: [ x] PTL precautions; [ ]  BF class; [ ]  childbirth class; [ ]   BF counseling;

## 2012-09-27 ENCOUNTER — Encounter: Payer: Medicaid Other | Admitting: Family Medicine

## 2012-11-09 LAB — OB RESULTS CONSOLE RPR: RPR: NONREACTIVE

## 2012-11-09 LAB — OB RESULTS CONSOLE HIV ANTIBODY (ROUTINE TESTING): HIV: NONREACTIVE

## 2012-11-09 LAB — OB RESULTS CONSOLE HEPATITIS B SURFACE ANTIGEN: HEP B S AG: NEGATIVE

## 2012-11-09 LAB — OB RESULTS CONSOLE ABO/RH: RH TYPE: POSITIVE

## 2012-11-09 LAB — OB RESULTS CONSOLE RUBELLA ANTIBODY, IGM: RUBELLA: IMMUNE

## 2012-11-09 LAB — OB RESULTS CONSOLE ANTIBODY SCREEN: ANTIBODY SCREEN: NEGATIVE

## 2012-12-07 ENCOUNTER — Other Ambulatory Visit: Payer: Self-pay

## 2013-01-23 ENCOUNTER — Encounter: Payer: Medicaid Other | Attending: Obstetrics and Gynecology

## 2013-01-23 VITALS — Ht 67.0 in | Wt 255.2 lb

## 2013-01-23 DIAGNOSIS — O99810 Abnormal glucose complicating pregnancy: Secondary | ICD-10-CM

## 2013-01-23 DIAGNOSIS — Z713 Dietary counseling and surveillance: Secondary | ICD-10-CM | POA: Insufficient documentation

## 2013-01-23 NOTE — Progress Notes (Signed)
  Patient was seen on 01/23/13 for Gestational Diabetes self-management class at the [facility]. The following learning objectives were met by the patient during this course:   States the definition of Gestational Diabetes  States why dietary management is important in controlling blood glucose  Describes the effects of carbohydrates on blood glucose levels  Demonstrates ability to create a balanced meal plan  Demonstrates carbohydrate counting   States when to check blood glucose levels  Demonstrates proper blood glucose monitoring techniques  States the effect of stress and exercise on blood glucose levels  States the importance of limiting caffeine and abstaining from alcohol and smoking  Plan:  Aim for 2 Carb Choices per meal (30 grams) +/- 1 either way for breakfast Aim for 3 Carb Choices per meal (45 grams) +/- 1 either way from lunch and dinner Aim for 1-2 Carbs per snack Begin reading food labels for Total Carbohydrate and sugar grams of foods Consider  increasing your activity level by walking daily as tolerated Begin checking BG before breakfast and 1-2 hours after first bit of breakfast, lunch and dinner after  as directed by MD  Take medication  as directed by MD  Blood glucose monitor given:  One Touch Ultra Self Monitoring Kit Lot # [id number] X Exp: 04/2014 Blood glucose reading: 79mg /dl   Patient instructed to monitor glucose levels: FBS: 60 - <90 1 hour: <140 2 hour: <120  Patient received the following handouts:  Nutrition Diabetes and Pregnancy  Carbohydrate Counting List  Meal Planning worksheet  Patient will be seen for follow-up as needed.

## 2013-02-01 NOTE — L&D Delivery Note (Signed)
Delivery Note At 1:33 AM a viable female was delivered via Vaginal, Spontaneous Delivery (Presentation: ; Occiput Posterior).  APGAR: 8, 9; weight pending.   Placenta status: Intact, spontaneous.  Cord: 3 vessels with the following complications: None.  Cord pH: not collected  Anesthesia: Epidural  Episiotomy: None Lacerations: Deep right sulcus tear extending up the right labia, repair by Dr. Su Hiltoberts Suture Repair: 2.0 3.0 vicryl Est. Blood Loss (mL): 400  Mom to postpartum.  Baby to Couplet care / Skin to Skin.  Routine PP orders Breastfeeding Outpt circ  Emily David 03/23/2013, 2:25 AM

## 2013-02-16 ENCOUNTER — Encounter: Payer: Self-pay | Admitting: *Deleted

## 2013-02-16 ENCOUNTER — Encounter: Payer: Medicaid Other | Attending: Obstetrics and Gynecology | Admitting: *Deleted

## 2013-02-16 VITALS — Ht 67.0 in | Wt 263.4 lb

## 2013-02-16 DIAGNOSIS — O24419 Gestational diabetes mellitus in pregnancy, unspecified control: Secondary | ICD-10-CM

## 2013-02-16 DIAGNOSIS — Z713 Dietary counseling and surveillance: Secondary | ICD-10-CM | POA: Insufficient documentation

## 2013-02-16 DIAGNOSIS — O99810 Abnormal glucose complicating pregnancy: Secondary | ICD-10-CM | POA: Insufficient documentation

## 2013-02-16 NOTE — Progress Notes (Signed)
Insulin Instruction  Patient was seen on 02/16/13 for insulin instruction.  The following learning objectives were met by the patient during this visit:   Insulin Action of NPH and Regular insulins for her information only. She states she is currently taking 10 units NPH at night  Reviewed syringe & vial VS pen including # units per syringe,    length of needles, vial VS Pen cartridge and needles  Hygiene and storage  Drawing up single and mixed doses if using vials   Single dose   Mixed dose:   Rotation of Sites  Hypoglycemia- symptoms, causes , treatment choices  Record keeping and MD follow up  Hypoglycemia, causes, symptoms and treatment   Patient demonstrated understanding of insulin administration by verbal description of how she is currently giving it at home.  Patient received the following handouts:  Insulin Instruction Handout  Mixing Insulin Brochure by BD Getting Started  Insulin Action handout                                        Patient to continue taking insulin as Rx'd by MD  Patient will be seen for follow-up as needed.

## 2013-02-27 LAB — OB RESULTS CONSOLE GBS: GBS: NEGATIVE

## 2013-03-19 ENCOUNTER — Telehealth (HOSPITAL_COMMUNITY): Payer: Self-pay | Admitting: *Deleted

## 2013-03-19 NOTE — Telephone Encounter (Signed)
Preadmission screen  

## 2013-03-21 ENCOUNTER — Inpatient Hospital Stay (HOSPITAL_COMMUNITY)
Admission: RE | Admit: 2013-03-21 | Discharge: 2013-03-27 | DRG: 774 | Disposition: A | Discharge status: Home / Self Care | Payer: Medicaid Other | Source: Ambulatory Visit | Attending: Obstetrics and Gynecology | Admitting: Obstetrics and Gynecology

## 2013-03-21 ENCOUNTER — Encounter (HOSPITAL_COMMUNITY): Payer: Self-pay

## 2013-03-21 VITALS — BP 116/75 | HR 116 | Temp 97.5°F | Resp 18 | Ht 67.0 in | Wt 276.3 lb

## 2013-03-21 DIAGNOSIS — O99893 Other specified diseases and conditions complicating puerperium: Secondary | ICD-10-CM | POA: Diagnosis not present

## 2013-03-21 DIAGNOSIS — Z87891 Personal history of nicotine dependence: Secondary | ICD-10-CM

## 2013-03-21 DIAGNOSIS — Q602 Renal agenesis, unspecified: Secondary | ICD-10-CM

## 2013-03-21 DIAGNOSIS — O99344 Other mental disorders complicating childbirth: Secondary | ICD-10-CM | POA: Diagnosis present

## 2013-03-21 DIAGNOSIS — Z3491 Encounter for supervision of normal pregnancy, unspecified, first trimester: Secondary | ICD-10-CM

## 2013-03-21 DIAGNOSIS — O99814 Abnormal glucose complicating childbirth: Principal | ICD-10-CM | POA: Diagnosis present

## 2013-03-21 DIAGNOSIS — R000 Tachycardia, unspecified: Secondary | ICD-10-CM | POA: Diagnosis not present

## 2013-03-21 DIAGNOSIS — D72825 Bandemia: Secondary | ICD-10-CM | POA: Diagnosis present

## 2013-03-21 DIAGNOSIS — W19XXXA Unspecified fall, initial encounter: Secondary | ICD-10-CM | POA: Diagnosis not present

## 2013-03-21 DIAGNOSIS — O9903 Anemia complicating the puerperium: Secondary | ICD-10-CM | POA: Diagnosis not present

## 2013-03-21 DIAGNOSIS — Y9210 Unspecified residential institution as the place of occurrence of the external cause: Secondary | ICD-10-CM | POA: Diagnosis not present

## 2013-03-21 DIAGNOSIS — O9989 Other specified diseases and conditions complicating pregnancy, childbirth and the puerperium: Secondary | ICD-10-CM

## 2013-03-21 DIAGNOSIS — F319 Bipolar disorder, unspecified: Secondary | ICD-10-CM | POA: Diagnosis present

## 2013-03-21 DIAGNOSIS — D72829 Elevated white blood cell count, unspecified: Secondary | ICD-10-CM | POA: Diagnosis present

## 2013-03-21 DIAGNOSIS — Q605 Renal hypoplasia, unspecified: Secondary | ICD-10-CM

## 2013-03-21 DIAGNOSIS — Z794 Long term (current) use of insulin: Secondary | ICD-10-CM

## 2013-03-21 DIAGNOSIS — O864 Pyrexia of unknown origin following delivery: Secondary | ICD-10-CM | POA: Diagnosis not present

## 2013-03-21 DIAGNOSIS — D649 Anemia, unspecified: Secondary | ICD-10-CM | POA: Diagnosis not present

## 2013-03-21 DIAGNOSIS — O24419 Gestational diabetes mellitus in pregnancy, unspecified control: Secondary | ICD-10-CM | POA: Diagnosis present

## 2013-03-21 DIAGNOSIS — J45909 Unspecified asthma, uncomplicated: Secondary | ICD-10-CM | POA: Diagnosis present

## 2013-03-21 LAB — TYPE AND SCREEN
ABO/RH(D): A POS
Antibody Screen: NEGATIVE

## 2013-03-21 LAB — ABO/RH: ABO/RH(D): A POS

## 2013-03-21 LAB — CBC
HCT: 35.7 % — ABNORMAL LOW (ref 36.0–46.0)
Hemoglobin: 11.9 g/dL — ABNORMAL LOW (ref 12.0–15.0)
MCH: 26.9 pg (ref 26.0–34.0)
MCHC: 33.3 g/dL (ref 30.0–36.0)
MCV: 80.8 fL (ref 78.0–100.0)
Platelets: 358 10*3/uL (ref 150–400)
RBC: 4.42 MIL/uL (ref 3.87–5.11)
RDW: 14.5 % (ref 11.5–15.5)
WBC: 23.2 10*3/uL — ABNORMAL HIGH (ref 4.0–10.5)

## 2013-03-21 MED ORDER — ZOLPIDEM TARTRATE 5 MG PO TABS
5.0000 mg | ORAL_TABLET | Freq: Every evening | ORAL | Status: DC | PRN
  Administered 2013-03-22: 5 mg via ORAL
  Filled 2013-03-21: qty 1

## 2013-03-21 MED ORDER — NALBUPHINE HCL 10 MG/ML IJ SOLN
10.0000 mg | INTRAMUSCULAR | Status: DC | PRN
  Administered 2013-03-22: 10 mg via INTRAVENOUS
  Filled 2013-03-21: qty 1

## 2013-03-21 MED ORDER — INSULIN NPH (HUMAN) (ISOPHANE) 100 UNIT/ML ~~LOC~~ SUSP
14.0000 [IU] | Freq: Every day | SUBCUTANEOUS | Status: DC
  Administered 2013-03-21: 14 [IU] via SUBCUTANEOUS
  Filled 2013-03-21: qty 10

## 2013-03-21 MED ORDER — IBUPROFEN 600 MG PO TABS
600.0000 mg | ORAL_TABLET | Freq: Four times a day (QID) | ORAL | Status: DC | PRN
  Administered 2013-03-23: 600 mg via ORAL
  Filled 2013-03-21: qty 1

## 2013-03-21 MED ORDER — CITRIC ACID-SODIUM CITRATE 334-500 MG/5ML PO SOLN: 30.0000 mL | ORAL | Status: DC | PRN

## 2013-03-21 MED ORDER — OXYTOCIN 40 UNITS IN LACTATED RINGERS INFUSION - SIMPLE MED: 62.5000 mL/h | INTRAVENOUS | Status: DC

## 2013-03-21 MED ORDER — OXYTOCIN BOLUS FROM INFUSION
500.0000 mL | INTRAVENOUS | Status: DC
  Administered 2013-03-23: 500 mL via INTRAVENOUS

## 2013-03-21 MED ORDER — INSULIN REGULAR HUMAN 100 UNIT/ML IJ SOLN: 4.0000 [IU] | Freq: Every day | INTRAMUSCULAR | Status: DC

## 2013-03-21 MED ORDER — INSULIN NPH (HUMAN) (ISOPHANE) 100 UNIT/ML ~~LOC~~ SUSP
4.0000 [IU] | Freq: Every day | SUBCUTANEOUS | Status: DC
  Administered 2013-03-22: 4 [IU] via SUBCUTANEOUS

## 2013-03-21 MED ORDER — LACTATED RINGERS IV SOLN: 500.0000 mL | INTRAVENOUS | Status: DC | PRN

## 2013-03-21 MED ORDER — PROMETHAZINE HCL 25 MG/ML IJ SOLN: 25.0000 mg | Freq: Once | INTRAMUSCULAR | Status: DC

## 2013-03-21 MED ORDER — LACTATED RINGERS IV SOLN
INTRAVENOUS | Status: DC
  Administered 2013-03-22: 21:00:00 via INTRAVENOUS

## 2013-03-21 MED ORDER — ACETAMINOPHEN 325 MG PO TABS: 650.0000 mg | ORAL_TABLET | ORAL | Status: DC | PRN

## 2013-03-21 MED ORDER — OXYCODONE-ACETAMINOPHEN 5-325 MG PO TABS: 1.0000 | ORAL_TABLET | ORAL | Status: DC | PRN

## 2013-03-21 MED ORDER — OXYTOCIN 40 UNITS IN LACTATED RINGERS INFUSION - SIMPLE MED
1.0000 m[IU]/min | INTRAVENOUS | Status: DC
  Administered 2013-03-22: 19 m[IU]/min via INTRAVENOUS
  Administered 2013-03-22: 1 m[IU]/min via INTRAVENOUS
  Filled 2013-03-21: qty 1000

## 2013-03-21 MED ORDER — ONDANSETRON HCL 4 MG/2ML IJ SOLN
4.0000 mg | Freq: Four times a day (QID) | INTRAMUSCULAR | Status: DC | PRN
  Administered 2013-03-23: 4 mg via INTRAVENOUS
  Filled 2013-03-21: qty 2

## 2013-03-21 MED ORDER — LIDOCAINE HCL (PF) 1 % IJ SOLN
30.0000 mL | INTRAMUSCULAR | Status: AC | PRN
  Administered 2013-03-23: 30 mL via SUBCUTANEOUS
  Filled 2013-03-21: qty 30

## 2013-03-21 MED ORDER — MISOPROSTOL 25 MCG QUARTER TABLET
25.0000 ug | ORAL_TABLET | ORAL | Status: DC | PRN
  Administered 2013-03-21 – 2013-03-22 (×3): 25 ug via VAGINAL
  Filled 2013-03-21: qty 1
  Filled 2013-03-21 (×3): qty 0.25

## 2013-03-21 NOTE — H&P (Signed)
Emily David is a 23 y.o. female presenting for IOL at 39wks for GDM on insulin. Pt denies reg ctx, no VB or LOF, GFM.   PNC began at 13wks at [facility]  Nl 1st trim screen and AFP  Early 1hr gtt at 20wks nl,  Anatomy US nl, except limited cardiac views  F/u anatomy at 24wks, still unable to view DA and LVOT, otherwise nl  Keflex PO given at 23wks for furuncles under breasts  Bartholin cyst I&D at 5233w6d Repeat 1hr gtt elevated, f/u 3hr = diabetes, pt sent for DM education  33wks insulin started  CBG's still elevated, insulin dose increased  Growth US at 33wks 4#5oz/50%  Growth at 35w 5#15oz/60%  Growth at 37wks 7#1oz/68% CBG's improved GBS/GC/CT neg     Maternal Medical History:  Reason for admission: IOL for GDM   Contractions: Frequency: rare.    Fetal activity: Perceived fetal activity is normal.   Last perceived fetal movement was within the past hour.    Prenatal complications: no prenatal complications Prenatal Complications - Diabetes: gestational. Diabetes is managed by insulin injections.      OB History   Grav Para Term Preterm Abortions TAB SAB Ect Mult Living   1              Past Medical History  Diagnosis Date  . Mood swings   . Boil   . Bartholin cyst   . Congenital absence of one kidney   . Asthma     winter related when sick  . Mental disorder     hx of bipolar  . Gestational diabetes mellitus, currently pregnant    Past Surgical History  Procedure Laterality Date  . Nephrectomy  1991    left   . Adenoidectomy    . Tonsillectomy     Family History: family history includes Cancer in her maternal grandmother; Diabetes in her maternal grandfather; Kidney disease in her maternal grandmother. Social History:  reports that she quit smoking about 8 weeks ago. Her smoking use included Cigarettes. She has a .5 pack-year smoking history. She has never used smokeless tobacco. She reports that she does not drink alcohol or use illicit drugs.   Prenatal  Transfer Tool  Maternal Diabetes: Yes:  Diabetes Type:  Insulin/Medication controlled Genetic Screening: Normal Maternal Ultrasounds/Referrals: Normal Fetal Ultrasounds or other Referrals:  None Maternal Substance Abuse:  No Significant Maternal Medications:  Meds include: Other:  insulin  Significant Maternal Lab Results:  Lab values include: Group B Strep negative Other Comments:  None  Review of Systems  All other systems reviewed and are negative.    Dilation: 1 Effacement (%): Thick Station: -3 Exam by:: LCarpenter,RN Blood pressure 127/79, pulse 120, temperature 98.1 F (36.7 C), temperature source Oral, resp. rate 20, height 5\' 7"  (1.702 m), weight 268 lb (121.564 kg), last menstrual period 06/21/2012. Maternal Exam:  Uterine Assessment: Contraction frequency is rare.   Abdomen: Patient reports no abdominal tenderness. Fundal height is aga.   Estimated fetal weight is 7#1oz/68% at 707w1d .   Fetal presentation: vertex  Introitus: Normal vulva. Normal vagina.  Ferning test: not done.   Pelvis: adequate for delivery.   Cervix: Cervix evaluated by digital exam.     Fetal Exam Fetal Monitor Review: Mode: ultrasound.    Fetal State Assessment: Category I - tracings are normal.     Physical Exam  Nursing note and vitals reviewed. Constitutional: She is oriented to person, place, and time. She appears well-developed and well-nourished.  HENT:  Head: Normocephalic.  Eyes: Pupils are equal, round, and reactive to light.  Neck: Normal range of motion.  Cardiovascular: Normal rate, regular rhythm and normal heart sounds.   Respiratory: Effort normal and breath sounds normal.  GI: Soft. Bowel sounds are normal.  Genitourinary: Vagina normal.  Musculoskeletal: Normal range of motion.  Neurological: She is alert and oriented to person, place, and time. She has normal reflexes.  Skin: Skin is warm and dry.  Psychiatric: She has a normal mood and affect. Her behavior is  normal.    Prenatal labs: ABO, Rh: --/--/A POS (02/18 2210) Antibody: PENDING (02/18 2210) Rubella: Immune (10/09 0000) RPR: Nonreactive (10/09 0000)  HBsAg: Negative (10/09 0000)  HIV: Non-reactive (10/09 0000)  GBS: Negative (01/27 0000)   Assessment/Plan: IUP at 39w GDM on insulin FHR cat 1 toco quiet GBS neg Bishop score 2  Admit to b.s per c/w DR Ozan Routine L&D orders Carb modified diet tonight and in AM 14units insulin HS and 4units in AM cytotec PV q4h overnight, and pitocin in the AM or when appropriate      Emily David M 03/21/2013, 10:35 PM

## 2013-03-22 ENCOUNTER — Encounter (HOSPITAL_COMMUNITY): Payer: Self-pay

## 2013-03-22 ENCOUNTER — Encounter (HOSPITAL_COMMUNITY): Payer: Medicaid Other | Admitting: Anesthesiology

## 2013-03-22 ENCOUNTER — Inpatient Hospital Stay (HOSPITAL_COMMUNITY): Payer: Medicaid Other | Admitting: Anesthesiology

## 2013-03-22 LAB — GLUCOSE, CAPILLARY
GLUCOSE-CAPILLARY: 76 mg/dL (ref 70–99)
GLUCOSE-CAPILLARY: 91 mg/dL (ref 70–99)
GLUCOSE-CAPILLARY: 73 mg/dL (ref 70–99)
GLUCOSE-CAPILLARY: 76 mg/dL (ref 70–99)
GLUCOSE-CAPILLARY: 70 mg/dL (ref 70–99)
Glucose-Capillary: 81 mg/dL (ref 70–99)
Glucose-Capillary: 69 mg/dL — ABNORMAL LOW (ref 70–99)
Glucose-Capillary: 78 mg/dL (ref 70–99)
Glucose-Capillary: 71 mg/dL (ref 70–99)
Glucose-Capillary: 75 mg/dL (ref 70–99)
Glucose-Capillary: 73 mg/dL (ref 70–99)
Glucose-Capillary: 80 mg/dL (ref 70–99)
Glucose-Capillary: 76 mg/dL (ref 70–99)
Glucose-Capillary: 89 mg/dL (ref 70–99)

## 2013-03-22 LAB — RPR: RPR Ser Ql: NONREACTIVE

## 2013-03-22 MED ORDER — LIDOCAINE HCL (PF) 1 % IJ SOLN
INTRAMUSCULAR | Status: DC | PRN
  Administered 2013-03-22 (×4): 4 mL

## 2013-03-22 MED ORDER — EPHEDRINE 5 MG/ML INJ
10.0000 mg | INTRAVENOUS | Status: DC | PRN
  Filled 2013-03-22: qty 4
  Filled 2013-03-22: qty 2

## 2013-03-22 MED ORDER — PHENYLEPHRINE 40 MCG/ML (10ML) SYRINGE FOR IV PUSH (FOR BLOOD PRESSURE SUPPORT)
80.0000 ug | PREFILLED_SYRINGE | INTRAVENOUS | Status: DC | PRN
  Filled 2013-03-22: qty 2

## 2013-03-22 MED ORDER — LACTATED RINGERS IV SOLN
500.0000 mL | Freq: Once | INTRAVENOUS | Status: AC
  Administered 2013-03-22: 500 mL via INTRAVENOUS

## 2013-03-22 MED ORDER — SODIUM CHLORIDE 0.9 % IV SOLN
INTRAVENOUS | Status: DC
  Administered 2013-03-22: 0.1 [IU]/h via INTRAVENOUS
  Filled 2013-03-22: qty 1

## 2013-03-22 MED ORDER — DIPHENHYDRAMINE HCL 50 MG/ML IJ SOLN: 12.5000 mg | INTRAMUSCULAR | Status: DC | PRN

## 2013-03-22 MED ORDER — EPHEDRINE 5 MG/ML INJ
10.0000 mg | INTRAVENOUS | Status: DC | PRN
  Filled 2013-03-22: qty 2

## 2013-03-22 MED ORDER — PHENYLEPHRINE 40 MCG/ML (10ML) SYRINGE FOR IV PUSH (FOR BLOOD PRESSURE SUPPORT)
80.0000 ug | PREFILLED_SYRINGE | INTRAVENOUS | Status: DC | PRN
  Filled 2013-03-22: qty 2
  Filled 2013-03-22: qty 10

## 2013-03-22 MED ORDER — FENTANYL 2.5 MCG/ML BUPIVACAINE 1/10 % EPIDURAL INFUSION (WH - ANES)
14.0000 mL/h | INTRAMUSCULAR | Status: DC | PRN
  Administered 2013-03-22: 14 mL/h via EPIDURAL
  Filled 2013-03-22: qty 125

## 2013-03-22 MED ORDER — DEXTROSE IN LACTATED RINGERS 5 % IV SOLN
INTRAVENOUS | Status: DC
  Administered 2013-03-22 (×2): via INTRAVENOUS

## 2013-03-22 NOTE — Anesthesia Preprocedure Evaluation (Signed)
Anesthesia Evaluation  Patient identified by MRN, date of birth, ID band Patient awake    Reviewed: Allergy & Precautions, H&P , NPO status , Patient's Chart, lab work & pertinent test results, reviewed documented beta blocker date and time   History of Anesthesia Complications Negative for: history of anesthetic complications  Airway Mallampati: III TM Distance: >3 FB Neck ROM: full    Dental  (+) Teeth Intact, Poor Dentition   Pulmonary asthma (last inhaler use before Christmas, only when she is very sick) , former smoker,  breath sounds clear to auscultation        Cardiovascular negative cardio ROS  Rhythm:regular Rate:Normal     Neuro/Psych PSYCHIATRIC DISORDERS (bipolar) negative neurological ROS     GI/Hepatic negative GI ROS, Neg liver ROS, GERD-  Medicated,  Endo/Other  diabetes, Gestational, Insulin DependentMorbid obesity  Renal/GU 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     Musculoskeletal   Abdominal   Peds  Hematology negative hematology ROS (+)   Anesthesia Other Findings   Reproductive/Obstetrics (+) Pregnancy                           Anesthesia Physical Anesthesia Plan  ASA: III  Anesthesia Plan: Epidural   Post-op Pain Management:    Induction:   Airway Management Planned:   Additional Equipment:   Intra-op Plan:   Post-operative Plan:   Informed Consent: I have reviewed the patients History and Physical, chart, labs and discussed the procedure including the risks, benefits and alternatives for the proposed anesthesia with the patient or authorized representative who has indicated his/her understanding and acceptance.     Plan Discussed with:   Anesthesia Plan Comments:         Anesthesia Quick Evaluation

## 2013-03-22 NOTE — Anesthesia Procedure Notes (Signed)
Epidural Patient location during procedure: OB Start time: 03/22/2013 8:04 PM  Staffing Performed by: anesthesiologist   Preanesthetic Checklist Completed: patient identified, site marked, surgical consent, pre-op evaluation, timeout performed, IV checked, risks and benefits discussed and monitors and equipment checked  Epidural Patient position: sitting Prep: site prepped and draped and DuraPrep Patient monitoring: continuous pulse ox and blood pressure Approach: midline Injection technique: LOR air  Needle:  Needle type: Tuohy  Needle gauge: 17 G Needle length: 9 cm and 9 Needle insertion depth: 8 cm Catheter type: closed end flexible Catheter size: 19 Gauge Catheter at skin depth: 13 cm Test dose: negative  Assessment Events: blood not aspirated, injection not painful, no injection resistance, negative IV test and paresthesia (transient, right)  Additional Notes Discussed risk of headache, infection, bleeding, nerve injury and failed or incomplete block.  Patient voices understanding and wishes to proceed.  Epidural placed easily on first attempt. Transient right paresthesia.  Patient tolerated procedure well with no apparent complications.  Jasmine DecemberA. Rylan Kaufmann, MDReason for block:procedure for pain

## 2013-03-22 NOTE — Progress Notes (Signed)
Patient ID: Fransico HimKaitlyn Speers, female   DOB: 06/25/1990, 23 y.o.   MRN: [id number] BP 127/71  Pulse 75  Temp(Src) 97.8 F (36.6 C) (Oral)  Resp 20  Ht 5\' 7"  (1.702 m)  Wt 268 lb (121.564 kg)  BMI 41.96 kg/m2  LMP 06/21/2012 Pt without c/o Category 1 BS 69-71 cx 2/50/-3 Continue care.

## 2013-03-22 NOTE — Progress Notes (Signed)
  Subjective: Epidural just placed, pt more comfortable.  Objective: BP 110/59  Pulse 76  Temp(Src) 98.4 F (36.9 C) (Oral)  Resp 18  Ht 5\' 7"  (1.702 m)  Wt 268 lb (121.564 kg)  BMI 41.96 kg/m2  SpO2 99%  LMP 06/21/2012      FHT:  Cat I UC:   regular, every 2-4 minutes  SVE:   2-3 / 50 / -1 per RN  Assessment / Plan:  Induction of labor due to gestational diabetes,  progressing well on pitocin  Labor: Progressing normally  Preeclampsia: no signs or symptoms of toxicity  Fetal Wellbeing: Category I  Pain Control: Epidural  I/D: GBS neg; SROM at 1843; Afebrile  Anticipated MOD: NSVD   Emily David 03/22/2013, 10:22 PM

## 2013-03-23 ENCOUNTER — Encounter (HOSPITAL_COMMUNITY): Payer: Self-pay

## 2013-03-23 LAB — GLUCOSE, CAPILLARY
GLUCOSE-CAPILLARY: 102 mg/dL — AB (ref 70–99)
Glucose-Capillary: 82 mg/dL (ref 70–99)

## 2013-03-23 MED ORDER — PRENATAL MULTIVITAMIN CH
1.0000 | ORAL_TABLET | Freq: Every day | ORAL | Status: DC
  Administered 2013-03-23 – 2013-03-27 (×5): 1 via ORAL
  Filled 2013-03-23 (×5): qty 1

## 2013-03-23 MED ORDER — ONDANSETRON HCL 4 MG PO TABS: 4.0000 mg | ORAL_TABLET | ORAL | Status: DC | PRN

## 2013-03-23 MED ORDER — IBUPROFEN 600 MG PO TABS
600.0000 mg | ORAL_TABLET | Freq: Four times a day (QID) | ORAL | Status: DC
  Administered 2013-03-23 – 2013-03-27 (×17): 600 mg via ORAL
  Filled 2013-03-23 (×17): qty 1

## 2013-03-23 MED ORDER — WITCH HAZEL-GLYCERIN EX PADS
1.0000 "application " | MEDICATED_PAD | CUTANEOUS | Status: DC | PRN
  Administered 2013-03-23 – 2013-03-25 (×3): 1 via TOPICAL

## 2013-03-23 MED ORDER — BENZOCAINE-MENTHOL 20-0.5 % EX AERO
1.0000 "application " | INHALATION_SPRAY | Freq: Four times a day (QID) | CUTANEOUS | Status: DC | PRN
  Filled 2013-03-23 (×2): qty 56

## 2013-03-23 MED ORDER — SENNOSIDES-DOCUSATE SODIUM 8.6-50 MG PO TABS
2.0000 | ORAL_TABLET | ORAL | Status: DC
  Administered 2013-03-23 – 2013-03-26 (×4): 2 via ORAL
  Filled 2013-03-23 (×5): qty 2

## 2013-03-23 MED ORDER — DIPHENHYDRAMINE HCL 25 MG PO CAPS: 25.0000 mg | ORAL_CAPSULE | Freq: Four times a day (QID) | ORAL | Status: DC | PRN

## 2013-03-23 MED ORDER — TETANUS-DIPHTH-ACELL PERTUSSIS 5-2.5-18.5 LF-MCG/0.5 IM SUSP: 0.5000 mL | Freq: Once | INTRAMUSCULAR | Status: DC

## 2013-03-23 MED ORDER — LANOLIN HYDROUS EX OINT
TOPICAL_OINTMENT | CUTANEOUS | Status: DC | PRN
Start: 2013-03-23 — End: 2013-03-27

## 2013-03-23 MED ORDER — ALBUTEROL SULFATE (2.5 MG/3ML) 0.083% IN NEBU: 3.0000 mL | INHALATION_SOLUTION | Freq: Four times a day (QID) | RESPIRATORY_TRACT | Status: DC | PRN

## 2013-03-23 MED ORDER — MEDROXYPROGESTERONE ACETATE 150 MG/ML IM SUSP: 150.0000 mg | INTRAMUSCULAR | Status: DC | PRN

## 2013-03-23 MED ORDER — DIBUCAINE 1 % RE OINT
1.0000 "application " | TOPICAL_OINTMENT | RECTAL | Status: DC | PRN
  Administered 2013-03-23: 1 via RECTAL
  Filled 2013-03-23: qty 28

## 2013-03-23 MED ORDER — OXYCODONE-ACETAMINOPHEN 5-325 MG PO TABS
1.0000 | ORAL_TABLET | ORAL | Status: DC | PRN
  Administered 2013-03-23 – 2013-03-24 (×4): 1 via ORAL
  Administered 2013-03-24: 2 via ORAL
  Administered 2013-03-24 (×2): 1 via ORAL
  Administered 2013-03-24 – 2013-03-25 (×6): 2 via ORAL
  Administered 2013-03-26: 1 via ORAL
  Administered 2013-03-26 – 2013-03-27 (×5): 2 via ORAL
  Filled 2013-03-23 (×3): qty 2
  Filled 2013-03-23: qty 1
  Filled 2013-03-23 (×2): qty 2
  Filled 2013-03-23 (×2): qty 1
  Filled 2013-03-23: qty 2
  Filled 2013-03-23 (×2): qty 1
  Filled 2013-03-23: qty 2
  Filled 2013-03-23: qty 1
  Filled 2013-03-23 (×5): qty 2
  Filled 2013-03-23: qty 1

## 2013-03-23 MED ORDER — SIMETHICONE 80 MG PO CHEW: 80.0000 mg | CHEWABLE_TABLET | ORAL | Status: DC | PRN

## 2013-03-23 MED ORDER — PANTOPRAZOLE SODIUM 40 MG PO TBEC
40.0000 mg | DELAYED_RELEASE_TABLET | Freq: Every day | ORAL | Status: DC
  Administered 2013-03-23 – 2013-03-27 (×5): 40 mg via ORAL
  Filled 2013-03-23 (×5): qty 1

## 2013-03-23 MED ORDER — BENZOCAINE-MENTHOL 20-0.5 % EX AERO
1.0000 "application " | INHALATION_SPRAY | CUTANEOUS | Status: DC | PRN
  Filled 2013-03-23: qty 56

## 2013-03-23 MED ORDER — ZOLPIDEM TARTRATE 5 MG PO TABS: 5.0000 mg | ORAL_TABLET | Freq: Every evening | ORAL | Status: DC | PRN

## 2013-03-23 MED ORDER — ONDANSETRON HCL 4 MG/2ML IJ SOLN: 4.0000 mg | INTRAMUSCULAR | Status: DC | PRN

## 2013-03-23 MED ORDER — PNEUMOCOCCAL VAC POLYVALENT 25 MCG/0.5ML IJ INJ
0.5000 mL | INJECTION | INTRAMUSCULAR | Status: DC
  Filled 2013-03-23: qty 0.5

## 2013-03-23 NOTE — Progress Notes (Signed)
UR chart review completed.  

## 2013-03-23 NOTE — Anesthesia Postprocedure Evaluation (Signed)
  Anesthesia Post-op Note  Patient: Emily David  Procedure(s) Performed: * No procedures listed *  Patient Location: PACU and Mother/Baby  Anesthesia Type:Epidural  Level of Consciousness: awake, alert  and oriented  Airway and Oxygen Therapy: Patient Spontanous Breathing  Post-op Pain: none  Post-op Assessment: Post-op Vital signs reviewed, Patient's Cardiovascular Status Stable, No headache, No backache, No residual numbness and No residual motor weakness  Post-op Vital Signs: Reviewed and stable  Complications: No apparent anesthesia complications

## 2013-03-23 NOTE — Progress Notes (Signed)
  Subjective: Pt doing well, [facility]  Objective: BP 119/52  Pulse 79  Temp(Src) 98.4 F (36.9 C) (Oral)  Resp 20  Ht 5\' 7"  (1.702 m)  Wt 268 lb (121.564 kg)  BMI 41.96 kg/m2  SpO2 99%  LMP 06/21/2012      FHT:  Cat I UC:   regular, every 2-4 minutes; seen interved  SVE:   Dilation: 10 Effacement (%): 100 Station: +2 Exam by:: JOxley, CNM  Assessment / Plan:  Induction of labor due to gestational diabetes,  progressing well on pitocin - 21 miliU Labor: Progressing normally; Will labor down for 1 hour and restart pushing at 0100 Preeclampsia: no signs or symptoms of toxicity  Fetal Wellbeing: Category I  Pain Control: Epidural  I/D: GBS neg; SROM at 1843; Afebrile  Anticipated MOD: NSVD   Bernadette Gores 03/23/2013, 12:34 AM

## 2013-03-23 NOTE — Progress Notes (Addendum)
Social visit.  Patient attempting to breastfeed.  [facility]  Patient denies needs at current, no complaints.    Stable CBG (last 3)   Recent Labs  03/22/13 2119 03/22/13 2229 03/23/13 0709  GLUCAP 70 89 82     Postpartum Day 0 Gestational DM  Continue present mgmt  Sanda KleinLillard, Naomi Fitton

## 2013-03-23 NOTE — Lactation Note (Signed)
This note was copied from the chart of Emily Fransico HimKaitlyn Deignan. Lactation Consultation Note Initial consult:  Baby Emily 5613 hours old and sleeping in FOB arms Taught mother hand expression, good flow of colostrum. Reviewed basics, cluster, cues, supply and demand, lactation support services and brochure. Mother and FOB attended breastfeeding classes.   Encouraged mother to call for assistance.   Patient Name: Emily David ZOXWR'UToday's Date: 03/23/2013 Reason for consult: Initial assessment   Maternal Data Formula Feeding for Exclusion: No Infant to breast within first hour of birth: Yes Has patient been taught Hand Expression?: Yes Does the patient have breastfeeding experience prior to this delivery?: No  Feeding    LATCH Score/Interventions                      Lactation Tools Discussed/Used     Consult Status Consult Status: Follow-up Date: 03/24/13 Follow-up type: In-patient    Dahlia ByesBerkelhammer, Pilar Corrales [facility] 03/23/2013, 2:43 PM

## 2013-03-23 NOTE — Progress Notes (Signed)
JOxley, CNM at bedside trial pushing with patient but decision to allow patient to labor down due to station at this time

## 2013-03-24 DIAGNOSIS — D72829 Elevated white blood cell count, unspecified: Secondary | ICD-10-CM | POA: Diagnosis present

## 2013-03-24 LAB — CBC WITH DIFFERENTIAL/PLATELET
BASOS ABS: 0 10*3/uL (ref 0.0–0.1)
Basophils Relative: 0 % (ref 0–1)
EOS ABS: 0 10*3/uL (ref 0.0–0.7)
Eosinophils Relative: 0 % (ref 0–5)
HCT: 26.8 % — ABNORMAL LOW (ref 36.0–46.0)
Hemoglobin: 9 g/dL — ABNORMAL LOW (ref 12.0–15.0)
LYMPHS PCT: 3 % — AB (ref 12–46)
Lymphs Abs: 1.4 10*3/uL (ref 0.7–4.0)
MCH: 27.1 pg (ref 26.0–34.0)
MCHC: 33.6 g/dL (ref 30.0–36.0)
MCV: 80.7 fL (ref 78.0–100.0)
MONO ABS: 2.8 10*3/uL — AB (ref 0.1–1.0)
Monocytes Relative: 6 % (ref 3–12)
NEUTROS PCT: 91 % — AB (ref 43–77)
Neutro Abs: 42.1 10*3/uL — ABNORMAL HIGH (ref 1.7–7.7)
PLATELETS: 223 10*3/uL (ref 150–400)
RBC: 3.32 MIL/uL — ABNORMAL LOW (ref 3.87–5.11)
RDW: 14.1 % (ref 11.5–15.5)
WBC MORPHOLOGY: INCREASED
WBC: 46.3 10*3/uL — ABNORMAL HIGH (ref 4.0–10.5)

## 2013-03-24 LAB — CBC
HCT: 26.4 % — ABNORMAL LOW (ref 36.0–46.0)
HEMOGLOBIN: 8.7 g/dL — AB (ref 12.0–15.0)
MCH: 26.8 pg (ref 26.0–34.0)
MCHC: 33 g/dL (ref 30.0–36.0)
MCV: 81.2 fL (ref 78.0–100.0)
PLATELETS: 232 10*3/uL (ref 150–400)
RBC: 3.25 MIL/uL — ABNORMAL LOW (ref 3.87–5.11)
RDW: 14.2 % (ref 11.5–15.5)
WBC: 48.2 10*3/uL — ABNORMAL HIGH (ref 4.0–10.5)

## 2013-03-24 LAB — GLUCOSE, CAPILLARY
GLUCOSE-CAPILLARY: 97 mg/dL (ref 70–99)
GLUCOSE-CAPILLARY: 97 mg/dL (ref 70–99)

## 2013-03-24 LAB — MRSA PCR SCREENING: MRSA by PCR: NEGATIVE

## 2013-03-24 MED ORDER — LACTATED RINGERS IV BOLUS (SEPSIS)
1000.0000 mL | Freq: Once | INTRAVENOUS | Status: AC
  Administered 2013-03-24: 1000 mL via INTRAVENOUS

## 2013-03-24 MED ORDER — CLINDAMYCIN PHOSPHATE 900 MG/50ML IV SOLN
900.0000 mg | Freq: Four times a day (QID) | INTRAVENOUS | Status: DC
  Administered 2013-03-24 – 2013-03-26 (×8): 900 mg via INTRAVENOUS
  Filled 2013-03-24 (×10): qty 50

## 2013-03-24 MED ORDER — LACTATED RINGERS IV SOLN
INTRAVENOUS | Status: DC
  Administered 2013-03-24 – 2013-03-26 (×7): via INTRAVENOUS

## 2013-03-24 MED ORDER — POLYSACCHARIDE IRON COMPLEX 150 MG PO CAPS
150.0000 mg | ORAL_CAPSULE | Freq: Two times a day (BID) | ORAL | Status: DC
  Administered 2013-03-24 – 2013-03-27 (×6): 150 mg via ORAL
  Filled 2013-03-24 (×7): qty 1

## 2013-03-24 MED ORDER — SODIUM CHLORIDE 0.9 % IV SOLN
2.0000 g | Freq: Four times a day (QID) | INTRAVENOUS | Status: DC
  Administered 2013-03-24 – 2013-03-26 (×8): 2 g via INTRAVENOUS
  Filled 2013-03-24 (×10): qty 2000

## 2013-03-24 MED ORDER — CYCLOBENZAPRINE HCL 5 MG PO TABS
5.0000 mg | ORAL_TABLET | Freq: Three times a day (TID) | ORAL | Status: DC | PRN
  Administered 2013-03-24: 5 mg via ORAL
  Filled 2013-03-24: qty 2

## 2013-03-24 MED ORDER — DEXTROSE 5 % IV SOLN
180.0000 mg | Freq: Three times a day (TID) | INTRAVENOUS | Status: DC
  Administered 2013-03-24 – 2013-03-26 (×5): 180 mg via INTRAVENOUS
  Filled 2013-03-24 (×6): qty 4.5

## 2013-03-24 MED ORDER — LACTATED RINGERS IV BOLUS (SEPSIS)
500.0000 mL | Freq: Once | INTRAVENOUS | Status: AC
  Administered 2013-03-24: 1000 mL via INTRAVENOUS

## 2013-03-24 NOTE — Progress Notes (Addendum)
S: RN called to notify me of pt's vital signs and recent assessment Pt denies any pain or other c[facility]o [facility] been improving [facility]   O:  Filed Vitals:   03/24/13 1427 03/24/13 1738 03/24/13 1750 03/24/13 1755  BP: 121/81 129/84    Pulse: 118 144 134   Temp: 97.9 F (36.6 C) 100.7 F (38.2 C)  102.5 F (39.2 C)  TempSrc: Oral Oral  Axillary  Resp: 20 20 18    Height:      Weight:      SpO2:        I/O last 3 completed shifts: In: -  Out: 850 [Urine:450; Blood:400] Total I/O In: 1700 [P.O.:1200; IV Piggyback:500] Out: 450 [Urine:450]  PE:  GEN: A&O x3, NAD, though does appear mildly anxious  Breast: soft, mild nipple trauma improving,  Lungs: CTAB Cardio: S1, S2, tachycardic  Abd: soft, NT, BSx4, FF-2U,  Pelvic: deferred  Ext: neg homan's, mild non-pitting edema   A: PPD1, SVD w R sulcus laceration Marked leukocytosis  FUO Hx bipolar disorder, no meds during pregnancy   P:  C/w Dr Gunnar BullaVernado  tx to AICU: routine AICU orders Triple ABX therapy:  Ampicillin 2g IV q6h Clindamycin 900mg  q8h  Gentamycin per pharmacy consult  LR bolus 1000cc, then 125cc/hour  Continue w strict I/O  Blood cultures Urine culture CTO closely  Emily David 03/24/13 @1835 

## 2013-03-24 NOTE — Progress Notes (Signed)
Postpartum day #1, NSVD  Overnight, pt had a fall in the bathroom.  Lost her balance and fell on right shoulder per CNM.  Subjective Pt without complaints currently.  Lochia normal.  Pain controlled.  Perineal pain minimal in bed, worsened with ambulation. Felt like she had chills and shakes earlier but comfortable now. H/o bipolar.  Denies anxiety.  Does not want to be on medications.  Was able to calm herself when she was having a moment during the pregnancy.  Temp:  [98 F (36.7 C)-99.5 F (37.5 C)] 98 F (36.7 C) (02/21 1300) Pulse Rate:  [117-140] 122 (02/21 1300) Resp:  [20-22] 20 (02/21 1300) BP: (108-133)/(74-80) 133/78 mmHg (02/21 1230) SpO2:  [95 %-99 %] 98 % (02/21 1230)  Gen:  NAD, A&O x 3, normal mood, not anxious. Uterine fundus:  Firm, nontender Lochia normal Vulva:  Right labia swollen but soft.  No signs of hematoma.  Moderately tender. CBC    Component Value Date/Time   WBC 46.3* 03/24/2013 1230   RBC 3.32* 03/24/2013 1230   HGB 9.0* 03/24/2013 1230   HCT 26.8* 03/24/2013 1230   PLT 223 03/24/2013 1230   MCV 80.7 03/24/2013 1230   MCH 27.1 03/24/2013 1230   MCHC 33.6 03/24/2013 1230   RDW 14.1 03/24/2013 1230   LYMPHSABS 1.4 03/24/2013 1230   MONOABS 2.8* 03/24/2013 1230   EOSABS 0.0 03/24/2013 1230   BASOSABS 0.0 03/24/2013 1230  Previous Hg 8.7, WBC 48.   A/P: S/p SVD doing well. Significant leukocytosis and bandemia. Afebrile.  Stable hemoglobin Tachycardiac prior to IVF bolus. Bipolar.    Pt clinically stable and will continue to observe.  Discuss with Infectious Disease.  Repeat CBC with diff in am. Routine postpartum care. S/p evaluation w/ SW.  Encouraged outpatient therapy. Lactation support. Anticipate discharge tomorrow.  Emily David 03/24/2013, 4:00 PM

## 2013-03-24 NOTE — Progress Notes (Signed)
Clinical Social Work Department PSYCHOSOCIAL ASSESSMENT - MATERNAL/CHILD 03/24/2013  Patient:  Emily David,Emily David  Account Number:  [id number]  Admit Date:  03/21/2013  Marjo Bickerhilds Name:   Emily David    Clinical Social Worker:  Keiasha Diep, LCSW   Date/Time:  03/24/2013 02:30 PM  Date Referred:  03/23/2013      Referred reason  Other - See comment   Other referral source:    I:  FAMILY / HOME ENVIRONMENT Child's legal guardian:  PARENT  Guardian - Name Guardian - Age Guardian - Address  Emily David,Emily David 23 [address]  [place], [place] [address]  Emily David, Emily David  same as above   Other household support members/support persons Other support:    II  PSYCHOSOCIAL DATA Information Source:    Event organiserinancial and Community Resources Employment:   FOB is employed   Surveyor, quantityinancial resources:  [employer]icaid If [employer] - [place]:   Other  [employer]   School / Grade:   Maternity Care Coordinator / Child Services Coordination / Early Interventions:  Cultural issues impacting care:    III  STRENGTHS Strengths  Supportive family/friends  Home prepared for Child (including basic supplies)  Adequate Resources   Strength comment:    IV  RISK FACTORS AND CURRENT PROBLEMS Current Problem:     Risk Factor & Current Problem Patient Issue Family Issue Risk Factor / Current Problem Comment  Mental Illness Y N Hx of bipolar illness    V  SOCIAL WORK ASSESSMENT Acknowledged order for Social Work consult to assess mother's history of bipolar.  Mother was receptive to social work intervention. Parents are not married.  They cohabitate.  Father was present during CSW visit and very attentive.  FOB is employed.  Mother reports that she plans to be a stay at home mom.  Mother acknowledge hx of bipolar.  She denies hx of psychiatric hospitalization. Informed that she was prescribed medication but did not like the way it made her feel so she stop takin it. Informed that she has been in therapy off and on and find it  beneficial.   She reports extensive family support which she attributes to her stable mood.  She denies any current symptoms of depression or anxiety. Mother notes that she will re-connect with mental health provider she used in the past if symptoms return and she can not manage them without intervention.   Discussed signs/symptoms of PP depression. Provided her with literature and treatment resources if needed.  Both parents were very receptive to the information.   Mother denies illicit drug use during pregnancy.    No acute social concerns related at this time.  Good interaction noted between parents and good bonding noted with newborn.   Mother informed of social work Surveyor, miningavailability.      VI SOCIAL WORK PLAN Social Work Plan  No Further Intervention Required / No Barriers to Discharge

## 2013-03-24 NOTE — Progress Notes (Signed)
Trying to breast feed baby at this time

## 2013-03-24 NOTE — Progress Notes (Signed)
Notified CNM Lillardof pt [id number] oral temp 100.7, axillary temp 102, No pain, I &O since 12noon. Other temp and tachycardia pt assess unchanged since this AM.

## 2013-03-24 NOTE — Progress Notes (Addendum)
Patient ID: Emily David, female   DOB: 12/12/1990, 23 y.o.   MRN: [id number] Post Partum Day 1, R sulcus tear Subjective: C/o back pain on R mid/lateral thoracic area, just started this AM. Also c/o perineal pain on R side. Had not been taking any percocet until recently.    Pt had fallen off toilet, after delivery. Had denied feeling dizzy at the time, states she just lost her balance, fell on L shoulder which is now sore, no bruising noted, no evidence of acute injury.   up ad lib without syncope, voiding, tolerating PO,   Recently c/o "chills" and shaking, pt thinks it was related to being in pain.  BF started   Mood stable, bonding well, [facility] other, pt seemed frustrated w them.  Contraception: undecided    Objective: Blood pressure 124/80, pulse 120, temperature 99.5 F (37.5 C), temperature source Oral, resp. rate 20, height 5\' 7"  (1.702 David), weight 268 lb (121.564 kg), last menstrual period 06/21/2012, SpO2 99.00%, unknown if currently breastfeeding.  Recent tachycardia noted   Physical Exam:  General: NAD, somewhat anxious  Skin: very warm to touch, pt was under several blankets  Lochia: appropriate Uterine Fundus: firm, non-tender  Perineum: healing, no evidence of hematoma  Muscular: Back warm, non-tender to touch, no evidence of acute injury  DVT Evaluation: No evidence of DVT seen on physical exam. Negative Homan's sign. No significant calf/ankle edema.   Recent Labs  03/21/13 1948 03/24/13 0555  HGB 11.9* 8.7*  HCT 35.7* 26.4*   CBC    Component Value Date/Time   WBC 48.2* 03/24/2013 0555   RBC 3.25* 03/24/2013 0555   HGB 8.7* 03/24/2013 0555   HCT 26.4* 03/24/2013 0555   PLT 232 03/24/2013 0555   MCV 81.2 03/24/2013 0555   MCH 26.8 03/24/2013 0555   MCHC 33.0 03/24/2013 0555   RDW 14.2 03/24/2013 0555   LYMPHSABS 3.1 08/07/2012 1424   MONOABS 0.5 08/07/2012 1424   EOSABS 0.1 08/07/2012 1424   BASOSABS 0.0 08/07/2012  1424      Per RN CBG 90's     Assessment/Plan: Plan for discharge tomorrow, Breastfeeding and Lactation consult Anemia Marked leukocytosis  low-grade temp, currently Tachycardia   Will repeat stat CBC w diff, now Will get K-pad Order flexeril Use percocet more regularly  Emotional support Watch vital signs closely Enc inc PO hydration  Strict I/O IVF bolus LR 500cc, then 125/hour   D/w Dr Dion BodyVarnado, and she will see pt as well         LOS: 3 days   Emily David 03/24/2013, 11:49 AM

## 2013-03-24 NOTE — Progress Notes (Addendum)
Request pain med now, "before it gets any worse" than "3"

## 2013-03-24 NOTE — Progress Notes (Signed)
Transported to AICU via wheelchair.  Report to RN Tia AlertPaige Grady.

## 2013-03-24 NOTE — Progress Notes (Signed)
ANTIBIOTIC CONSULT NOTE - INITIAL  Pharmacy Consult for Gentamicin Indication: post-partum leukocytosis  No Known Allergies  Patient Measurements: Height: 5\' 7"  (170.2 cm) Weight: 268 lb (121.564 kg) IBW/kg (Calculated) : 61.6 Adjusted Body Weight: 79.6 kg  Vital Signs: Temp: 103.1 F (39.5 C) (02/21 1900) Temp src: Oral (02/21 1900) BP: 151/79 mmHg (02/21 1900) Pulse Rate: 155 (02/21 1900)  Labs:  Recent Labs  03/21/13 1948 03/24/13 0555 03/24/13 1230  WBC 23.2* 48.2* 46.3*  HGB 11.9* 8.7* 9.0*  PLT 358 232 223   No results found for this basename: GENTTROUGH, GENTPEAK, GENTRANDOM,  in the last 72 hours   Microbiology: Recent Results (from the past 720 hour(s))  OB RESULTS CONSOLE GBS     Status: None   Collection Time    02/27/13 12:00 AM      Result Value Ref Range Status   GBS Negative   Final    Medications:  Ampicillin/clindamycin/gentamicin  Assessment: 23 y.o. female G1P1001 at 969w2d --  S/p delivery early this am Estimated Ke = 0.358, Vd = 27.9L  Goal of Therapy:  Gentamicin peak 6-8 mg/L and Trough < 1 mg/L  Plan:   Gentamicin 180 mg IV every 8 hrs  Check Scr with next labs if gentamicin continued. Will check gentamicin levels if continued > 72hr or clinically indicated.  Koltin Wehmeyer L. Illene BolusGrimsley, PharmD, BCPS Clinical Pharmacist Pager: [phone] Pharmacy: [phone] 03/24/2013 7:23 PM

## 2013-03-25 ENCOUNTER — Inpatient Hospital Stay (HOSPITAL_COMMUNITY): Payer: Medicaid Other

## 2013-03-25 LAB — COMPREHENSIVE METABOLIC PANEL
ALK PHOS: 128 U/L — AB (ref 39–117)
ALT: 9 U/L (ref 0–35)
AST: 15 U/L (ref 0–37)
Albumin: 2 g/dL — ABNORMAL LOW (ref 3.5–5.2)
BUN: 8 mg/dL (ref 6–23)
CO2: 20 mEq/L (ref 19–32)
Calcium: 8.4 mg/dL (ref 8.4–10.5)
Chloride: 104 mEq/L (ref 96–112)
Creatinine, Ser: 0.9 mg/dL (ref 0.50–1.10)
GFR, EST NON AFRICAN AMERICAN: 89 mL/min — AB (ref >90)
GLUCOSE: 122 mg/dL — AB (ref 70–99)
POTASSIUM: 4 meq/L (ref 3.7–5.3)
Sodium: 139 mEq/L (ref 137–147)
Total Protein: 5.6 g/dL — ABNORMAL LOW (ref 6.0–8.3)

## 2013-03-25 LAB — CBC WITH DIFFERENTIAL/PLATELET
BASOS ABS: 0 10*3/uL (ref 0.0–0.1)
Basophils Relative: 0 % (ref 0–1)
EOS PCT: 0 % (ref 0–5)
Eosinophils Absolute: 0 10*3/uL (ref 0.0–0.7)
HCT: 24.2 % — ABNORMAL LOW (ref 36.0–46.0)
Hemoglobin: 8 g/dL — ABNORMAL LOW (ref 12.0–15.0)
LYMPHS ABS: 2 10*3/uL (ref 0.7–4.0)
Lymphocytes Relative: 5 % — ABNORMAL LOW (ref 12–46)
MCH: 26.9 pg (ref 26.0–34.0)
MCHC: 33.1 g/dL (ref 30.0–36.0)
MCV: 81.5 fL (ref 78.0–100.0)
MONOS PCT: 4 % (ref 3–12)
Monocytes Absolute: 1.6 10*3/uL — ABNORMAL HIGH (ref 0.1–1.0)
NEUTROS PCT: 91 % — AB (ref 43–77)
Neutro Abs: 35.9 10*3/uL — ABNORMAL HIGH (ref 1.7–7.7)
PLATELETS: 214 10*3/uL (ref 150–400)
RBC: 2.97 MIL/uL — AB (ref 3.87–5.11)
RDW: 14.5 % (ref 11.5–15.5)
WBC MORPHOLOGY: INCREASED
WBC: 39.5 10*3/uL — AB (ref 4.0–10.5)

## 2013-03-25 LAB — BASIC METABOLIC PANEL
BUN: 8 mg/dL (ref 6–23)
CO2: 21 mEq/L (ref 19–32)
Calcium: 8.2 mg/dL — ABNORMAL LOW (ref 8.4–10.5)
Chloride: 103 mEq/L (ref 96–112)
Creatinine, Ser: 0.83 mg/dL (ref 0.50–1.10)
GFR calc Af Amer: >90 mL/min (ref >90)
GFR calc non Af Amer: >90 mL/min (ref >90)
GLUCOSE: 119 mg/dL — AB (ref 70–99)
POTASSIUM: 3.9 meq/L (ref 3.7–5.3)
Sodium: 136 mEq/L — ABNORMAL LOW (ref 137–147)

## 2013-03-25 LAB — URINALYSIS, ROUTINE W REFLEX MICROSCOPIC
Bilirubin Urine: NEGATIVE
Glucose, UA: NEGATIVE mg/dL
Ketones, ur: NEGATIVE mg/dL
Nitrite: NEGATIVE
Protein, ur: 30 mg/dL — AB
Specific Gravity, Urine: <1.005 — ABNORMAL LOW (ref 1.005–1.030)
Urobilinogen, UA: 0.2 mg/dL (ref 0.0–1.0)
pH: 6 (ref 5.0–8.0)

## 2013-03-25 LAB — URINE MICROSCOPIC-ADD ON

## 2013-03-25 MED ORDER — LACTATED RINGERS IV BOLUS (SEPSIS)
1000.0000 mL | Freq: Once | INTRAVENOUS | Status: AC
  Administered 2013-03-25: 1000 mL via INTRAVENOUS

## 2013-03-25 NOTE — Progress Notes (Signed)
Patient ID: Fransico HimKaitlyn Graveline, female   DOB: 06/11/1990, 23 y.o.   MRN: [id number] Post Partum Day 2, R [place]  Subjective:  Sitting up on BS, feels "great" has ambulated in halls  no complaints, up ad lib without syncope, voiding, tolerating PO,  Pain well controlled with po meds,  BF improving, did supplement once overnight  Mood stable, bonding well Contraception: undecided    Objective: Blood pressure 110/61, pulse 122, temperature 97.8 F (36.6 C), temperature source Oral, resp. rate 18, height 5\' 7"  (1.702 m), weight 271 lb 1.6 oz (122.97 kg), last menstrual period 06/21/2012, SpO2 100.00%, unknown if currently breastfeeding.  I/O last 3 completed shifts: In: 6532.1 [P.O.:2400; I.V.:3223.1; IV Piggyback:909] Out: 450 [Urine:450]     (am void missed collection)    Physical Exam:  General: NAD  Lochia: appropriate Uterine Fundus: firm Perineum: healing  DVT Evaluation: No evidence of DVT seen on physical exam. Negative Homan's sign. No significant calf/ankle edema.  Results for orders placed during the hospital encounter of 03/21/13 (from the past 24 hour(s))  GLUCOSE, CAPILLARY     Status: None   Collection Time    03/24/13 10:56 AM      Result Value Ref Range   Glucose-Capillary 97  70 - 99 mg/dL  CBC WITH DIFFERENTIAL     Status: Abnormal   Collection Time    03/24/13 12:30 PM      Result Value Ref Range   WBC 46.3 (*) 4.0 - 10.5 K/uL   RBC 3.32 (*) 3.87 - 5.11 MIL/uL   Hemoglobin 9.0 (*) 12.0 - 15.0 g/dL   HCT 40.926.8 (*) 81.136.0 - 91.446.0 %   MCV 80.7  78.0 - 100.0 fL   MCH 27.1  26.0 - 34.0 pg   MCHC 33.6  30.0 - 36.0 g/dL   RDW 78.214.1  95.611.5 - 21.315.5 %   Platelets 223  150 - 400 K/uL   Neutrophils Relative % 91 (*) 43 - 77 %   Lymphocytes Relative 3 (*) 12 - 46 %   Monocytes Relative 6  3 - 12 %   Eosinophils Relative 0  0 - 5 %   Basophils Relative 0  0 - 1 %   Neutro Abs 42.1 (*) 1.7 - 7.7 K/uL   Lymphs Abs 1.4  0.7 - 4.0 K/uL   Monocytes Absolute 2.8 (*) 0.1 -  1.0 K/uL   Eosinophils Absolute 0.0  0.0 - 0.7 K/uL   Basophils Absolute 0.0  0.0 - 0.1 K/uL   WBC Morphology INCREASED BANDS (>20% BANDS)    MRSA PCR SCREENING     Status: None   Collection Time    03/24/13  8:05 PM      Result Value Ref Range   MRSA by PCR NEGATIVE  NEGATIVE  CBC WITH DIFFERENTIAL     Status: Abnormal   Collection Time    03/25/13  5:20 AM      Result Value Ref Range   WBC 39.5 (*) 4.0 - 10.5 K/uL   RBC 2.97 (*) 3.87 - 5.11 MIL/uL   Hemoglobin 8.0 (*) 12.0 - 15.0 g/dL   HCT 08.624.2 (*) 57.836.0 - 46.946.0 %   MCV 81.5  78.0 - 100.0 fL   MCH 26.9  26.0 - 34.0 pg   MCHC 33.1  30.0 - 36.0 g/dL   RDW 62.914.5  52.811.5 - 41.315.5 %   Platelets 214  150 - 400 K/uL   Neutrophils Relative % 91 (*) 43 - 77 %  Lymphocytes Relative 5 (*) 12 - 46 %   Monocytes Relative 4  3 - 12 %   Eosinophils Relative 0  0 - 5 %   Basophils Relative 0  0 - 1 %   Neutro Abs 35.9 (*) 1.7 - 7.7 K/uL   Lymphs Abs 2.0  0.7 - 4.0 K/uL   Monocytes Absolute 1.6 (*) 0.1 - 1.0 K/uL   Eosinophils Absolute 0.0  0.0 - 0.7 K/uL   Basophils Absolute 0.0  0.0 - 0.1 K/uL   WBC Morphology INCREASED BANDS (>20% BANDS)    BASIC METABOLIC PANEL     Status: Abnormal   Collection Time    03/25/13  5:20 AM      Result Value Ref Range   Sodium 136 (*) 137 - 147 mEq/L   Potassium 3.9  3.7 - 5.3 mEq/L   Chloride 103  96 - 112 mEq/L   CO2 21  19 - 32 mEq/L   Glucose, Bld 119 (*) 70 - 99 mg/dL   BUN 8  6 - 23 mg/dL   Creatinine, Ser 1.61  0.50 - 1.10 mg/dL   Calcium 8.2 (*) 8.4 - 10.5 mg/dL   GFR calc non Af Amer >90  >90 mL/min   GFR calc Af Amer >90  >90 mL/min  COMPREHENSIVE METABOLIC PANEL     Status: Abnormal   Collection Time    03/25/13  5:20 AM      Result Value Ref Range   Sodium 139  137 - 147 mEq/L   Potassium 4.0  3.7 - 5.3 mEq/L   Chloride 104  96 - 112 mEq/L   CO2 20  19 - 32 mEq/L   Glucose, Bld 122 (*) 70 - 99 mg/dL   BUN 8  6 - 23 mg/dL   Creatinine, Ser 0.96  0.50 - 1.10 mg/dL   Calcium 8.4  8.4 -  04.5 mg/dL   Total Protein 5.6 (*) 6.0 - 8.3 g/dL   Albumin 2.0 (*) 3.5 - 5.2 g/dL   AST 15  0 - 37 U/L   ALT 9  0 - 35 U/L   Alkaline Phosphatase 128 (*) 39 - 117 U/L   Total Bilirubin <0.2 (*) 0.3 - 1.2 mg/dL   GFR calc non Af Amer 89 (*) >90 mL/min   GFR calc Af Amer >90  >90 mL/min     Assessment/Plan: PPD2 Elevated WBC and bandemia,  trending down Anemia  t-max 103.2 at 1900, w slow trend down to normal at midnight Mild tachycardia, also improving Output improving Breastfeeding  Continue ABX  Lactation consult Continue strict I/O  Will repeat CBC in the AM Dr Gunnar Bulla to follow       LOS: 4 days   Nemiah Kissner M 03/25/2013, 10:28 AM

## 2013-03-25 NOTE — Lactation Note (Signed)
This note was copied from the chart of Emily Fransico HimKaitlyn Lahti. Lactation Consultation Note    Follow up consult with this mom and term baby, SGA at 5-14.7. ( 6 -7 at birth)    Mom has been breast feeding on cue - baby  staying latched 20-60 minutes, and still acting hungry.  SNS tried, but baby not able to easily transfer formula. I started mom wiht DEP and advised that she feed this prior to formula. Mom is fine with bottle feeding - baby goes to breast and bottle easily. He is supplementing with about 20 mls of formula - he is feeding as much as he wants. Mom will pump every 3 hours , followed by hand expression.  Mom's milk is beginning to transition in - she expressed about 1 ml . Mom is active with WIC, and is going to call tomorrow, to add baby. And see if she can get a DEP . Mom knows to call for questions/concerns.  Patient Name: Emily Fransico HimKaitlyn Pizzi [name]: 03/25/2013 Reason for consult: Follow-up assessment   Maternal Data    Feeding Feeding Type: Breast Fed Length of feed: 30 min  LATCH Score/Interventions Latch: Grasps breast easily, tongue down, lips flanged, rhythmical sucking. Intervention(s): Skin to skin;Teach feeding cues;Waking techniques Intervention(s): Adjust position;Assist with latch  Audible Swallowing: A few with stimulation Intervention(s): Skin to skin;Hand expression  Type of Nipple: Everted at rest and after stimulation Intervention(s): Hand pump;Double electric pump  Comfort (Breast/Nipple): Soft / non-tender     Hold (Positioning): Assistance needed to correctly position infant at breast and maintain latch. Intervention(s): Breastfeeding basics reviewed;Support Pillows;Position options;Skin to skin  LATCH Score: 8  Lactation Tools Discussed/Used Tools: Pump Breast pump type: Double-Electric Breast Pump WIC Program: Yes Pump Review: Setup, frequency, and cleaning;Milk Storage;Other (comment) (standard setting, hand expression) Initiated by:: clee RN  LC Date initiated:: 03/25/13   Consult Status Consult Status: Follow-up Date: 03/26/13 Follow-up type: In-patient    Alfred LevinsLee, Mili Piltz Anne 03/25/2013, 1:39 PM

## 2013-03-25 NOTE — Progress Notes (Signed)
Fluid bolus ordered

## 2013-03-25 NOTE — Progress Notes (Signed)
Spoke with ID about significant leukocytosis with bandemia yesterday.  Was afebrile at the time.  Recommended observation.  Pt developed a fever 102 shortly after.  [facility] and [facility] observation due to concerns of impending sepsis.  Blood cultures x 2  drawn.  Overnight, pt was given IVF bolus.  CXR done b/c pt was wheezing due to asthma and had a productive cough.  Xray without pneumonia and pulmonary edema.  Tmax overnight was 103.1 at 7 pm.  Pt has been afebrile since.  Tachycardia resolved s/p IVF bolus.  Output not measured but she has voided several times.  S: Pt states she feels much better.  Able to walk and bend which was excruciating yesterday.  Pt feels taking Percocet and Ibuprofen together helped.  Reports normal lochia.  Pain in mons pubis, feels a hard area.  H/o vulvar abscess and recurrent Bartholin cysts.  Pain with fundal massage above uterus, more periumbilical. Denies SOB, chest pain. H/o lower respiratory infection 3-4 months ago.  Has productive cough since recovery. Breast feeding well.  Supplemented once overnight but baby latches well.  Tc 97.8  Hr 94-122 UOP 5 voids Gen:  NAD, A&O x 3.  Comfortable ambulating CV:  Mild tachycardia, no murmer, normal rhythm. Lungs:  CTA bilaterally, no crackles, rhonchi or wheezing heard. Abdomen:  Uterine fundus appropriately tender.  Soft, non-distended. No rebound or guarding. Pelvis:  Mons pubis edematous, mild-moderately tender.  3 cm lesion palpable at midline.  ?induration, no erythema  Labia appear normal, no swelling. Ext:  Edema present, no calf tenderness  Labs: WBC 46 to 39, still with >20% bands Hg 9.0 to 8.0 s/p hydration CMP normal.  Cr 0.8  Blood cultures pending.  No gram stain reported. Urine culture pending. UA not done.  Lab to run this am.  CXR as above.  A/p S/p SVD with deep sulcus tear repaired. H/o GDM-Blood sugar normal. Febrile morbidity with  significant leukocytosis and bandemia.    Day 1 of Amp/Gent/Clinda.  Trending down with antibiotics.  Etiology possible mons pubis abscess.  Clinically  do not suspect endometritis due to lack of pain and onset of fever.  Lower respiratory symptoms but pneumonia  ruled out.  UA pending. Tachycardia-concentrated urine. Asthma-remote h/o lower respiratory infection.    Continue IV antibiotics until WBC near normal and afebrile 24-48 hours.  CBC with diff in am. Continue IVF at 150 cc until urine clear and HR near normal. F/u UA, blood and urine cultures. Albuterol prn. Encouraged ambulation. Lactation support.

## 2013-03-25 NOTE — Progress Notes (Signed)
Serum Creatinine this morning = 8.3mg /dL.  Continue current gentamicin regimen.  Hurley CiscoMendenhall, Alexandro Line D Pharm.D.

## 2013-03-26 LAB — CBC WITH DIFFERENTIAL/PLATELET
BASOS ABS: 0 10*3/uL (ref 0.0–0.1)
Basophils Relative: 0 % (ref 0–1)
EOS PCT: 0 % (ref 0–5)
Eosinophils Absolute: 0.1 10*3/uL (ref 0.0–0.7)
HCT: 23.5 % — ABNORMAL LOW (ref 36.0–46.0)
Hemoglobin: 8 g/dL — ABNORMAL LOW (ref 12.0–15.0)
Lymphocytes Relative: 8 % — ABNORMAL LOW (ref 12–46)
Lymphs Abs: 1.3 10*3/uL (ref 0.7–4.0)
MCH: 27.1 pg (ref 26.0–34.0)
MCHC: 34 g/dL (ref 30.0–36.0)
MCV: 79.7 fL (ref 78.0–100.0)
Monocytes Absolute: 0.8 10*3/uL (ref 0.1–1.0)
Monocytes Relative: 4 % (ref 3–12)
NEUTROS ABS: 14.8 10*3/uL — AB (ref 1.7–7.7)
Neutrophils Relative %: 88 % — ABNORMAL HIGH (ref 43–77)
Platelets: 203 10*3/uL (ref 150–400)
RBC: 2.95 MIL/uL — ABNORMAL LOW (ref 3.87–5.11)
RDW: 14.3 % (ref 11.5–15.5)
WBC: 16.9 10*3/uL — AB (ref 4.0–10.5)

## 2013-03-26 LAB — URINE CULTURE
Colony Count: NO GROWTH
Culture: NO GROWTH

## 2013-03-26 MED ORDER — AMOXICILLIN-POT CLAVULANATE 875-125 MG PO TABS
1.0000 | ORAL_TABLET | Freq: Two times a day (BID) | ORAL | Status: DC
  Administered 2013-03-26 – 2013-03-27 (×2): 1 via ORAL
  Filled 2013-03-26 (×2): qty 1

## 2013-03-26 NOTE — Progress Notes (Signed)
UR chart review completed.  

## 2013-03-26 NOTE — Plan of Care (Signed)
Problem: Discharge Progression Outcomes Goal: Activity appropriate for discharge plan Outcome: Completed/Met Date Met:  03/26/13 Up in room frequently,and tolerates well. Goal: Pain controlled with appropriate interventions Outcome: Completed/Met Date Met:  03/26/13 Good pain control with Motrin and Percocet Goal: Discharge plan in place and appropriate Outcome: Completed/Met Date Met:  03/26/13 VSS/Afebrile Pain controlled Bleeding wnl Understands self care and f/u care Understands infant care and f/u

## 2013-03-26 NOTE — Progress Notes (Addendum)
Post Partum Day 3 Subjective: no complaints, up ad lib, voiding, tolerating PO, + flatus and no BM.  Pt bottle and breast feeding.  She feels much better today..  Says fundus feels a little uncomfortable.  Would like to go home.  Objective: Blood pressure 112/74, pulse 115, temperature 98.1 F (36.7 C), temperature source Oral, resp. rate 20, height 5\' 7"  (1.702 m), weight 125.335 kg (276 lb 5 oz), last menstrual period 06/21/2012, SpO2 98.00%, unknown if currently breastfeeding.  Physical Exam:  General: alert and no distress Lochia: appropriate Uterine Fundus: firm, NT Incision: healing well DVT Evaluation: No evidence of DVT seen on physical exam. @+ edema Pelvic - no palpable hematoma, healing well   Recent Labs  03/25/13 0520 03/26/13 0525  HGB 8.0* 8.0*  HCT 24.2* 23.5*   Blood cx x 1 with group A strep  Assessment/Plan: Plan for discharge tomorrow.  Will change to augmentin and if afebrile x 24 hrs, may consider d/c home with a total of 14 days of antibiotics.  Pt significantly improved.  WBC improving and will recheck in am.  Pt is aymptomatically anemic.  Iron and fluids.  Creatinine slightly elevated but BPS are good.  UA had 30 of protein with significant amount of blood contaminating specimen.  Doubt preeclampsia.  Will cont to observe.  Recheck CMET in am as well.   LOS: 5 days   Emily David Y 03/26/2013, 1:06 PM

## 2013-03-26 NOTE — Progress Notes (Signed)
Report given by telephone to Rushie ChestnutMelinda Kozak, RN.

## 2013-03-27 LAB — CBC WITH DIFFERENTIAL/PLATELET
BAND NEUTROPHILS: 0 % (ref 0–10)
BLASTS: 0 %
Basophils Absolute: 0 10*3/uL (ref 0.0–0.1)
Basophils Relative: 0 % (ref 0–1)
EOS PCT: 1 % (ref 0–5)
Eosinophils Absolute: 0.1 10*3/uL (ref 0.0–0.7)
HEMATOCRIT: 25.3 % — AB (ref 36.0–46.0)
Hemoglobin: 8.5 g/dL — ABNORMAL LOW (ref 12.0–15.0)
LYMPHS ABS: 1.4 10*3/uL (ref 0.7–4.0)
LYMPHS PCT: 10 % — AB (ref 12–46)
MCH: 26.6 pg (ref 26.0–34.0)
MCHC: 33.6 g/dL (ref 30.0–36.0)
MCV: 79.1 fL (ref 78.0–100.0)
MONO ABS: 0.7 10*3/uL (ref 0.1–1.0)
MONOS PCT: 5 % (ref 3–12)
Metamyelocytes Relative: 0 %
Myelocytes: 0 %
NRBC: 0 /100{WBCs}
Neutro Abs: 11.6 10*3/uL — ABNORMAL HIGH (ref 1.7–7.7)
Neutrophils Relative %: 84 % — ABNORMAL HIGH (ref 43–77)
PLATELETS: 238 10*3/uL (ref 150–400)
Promyelocytes Absolute: 0 %
RBC: 3.2 MIL/uL — ABNORMAL LOW (ref 3.87–5.11)
RDW: 14.4 % (ref 11.5–15.5)
WBC: 13.8 10*3/uL — AB (ref 4.0–10.5)

## 2013-03-27 LAB — COMPREHENSIVE METABOLIC PANEL
ALT: 29 U/L (ref 0–35)
AST: 49 U/L — ABNORMAL HIGH (ref 0–37)
Albumin: 2 g/dL — ABNORMAL LOW (ref 3.5–5.2)
Alkaline Phosphatase: 362 U/L — ABNORMAL HIGH (ref 39–117)
BUN: 10 mg/dL (ref 6–23)
CALCIUM: 8.1 mg/dL — AB (ref 8.4–10.5)
CO2: 23 mEq/L (ref 19–32)
Chloride: 101 mEq/L (ref 96–112)
Creatinine, Ser: 0.98 mg/dL (ref 0.50–1.10)
GFR calc non Af Amer: 80 mL/min — ABNORMAL LOW (ref >90)
Glucose, Bld: 80 mg/dL (ref 70–99)
Potassium: 3.8 mEq/L (ref 3.7–5.3)
Sodium: 136 mEq/L — ABNORMAL LOW (ref 137–147)
TOTAL PROTEIN: 5.9 g/dL — AB (ref 6.0–8.3)
Total Bilirubin: 0.9 mg/dL (ref 0.3–1.2)

## 2013-03-27 MED ORDER — OXYCODONE-ACETAMINOPHEN 5-325 MG PO TABS: 1.0000 | ORAL_TABLET | ORAL | Status: DC | PRN

## 2013-03-27 MED ORDER — AMOXICILLIN-POT CLAVULANATE 875-125 MG PO TABS
1.0000 | ORAL_TABLET | Freq: Two times a day (BID) | ORAL | Status: DC
Start: 2013-03-27 — End: 2015-03-22

## 2013-03-27 MED ORDER — IBUPROFEN 600 MG PO TABS: 600.0000 mg | ORAL_TABLET | Freq: Four times a day (QID) | ORAL | Status: DC

## 2013-03-27 MED ORDER — POLYSACCHARIDE IRON COMPLEX 150 MG PO CAPS: 150.0000 mg | ORAL_CAPSULE | Freq: Two times a day (BID) | ORAL | Status: DC

## 2013-03-27 NOTE — Discharge Instructions (Signed)
Vaginal Delivery °Care After °Refer to this sheet in the next few weeks. These discharge instructions provide you with information on caring for yourself after delivery. Your caregiver may also give you specific instructions. Your treatment has been planned according to the most current medical practices available, but problems sometimes occur. Call your caregiver if you have any problems or questions after you go home. °HOME CARE INSTRUCTIONS °· Take over-the-counter or prescription medicines only as directed by your caregiver or pharmacist. °· Do not drink alcohol, especially if you are breastfeeding or taking medicine to relieve pain. °· Do not chew or smoke tobacco. °· Do not use illegal drugs. °· Continue to use good perineal care. Good perineal care includes: °· Wiping your perineum from front to back. °· Keeping your perineum clean. °· Do not use tampons or douche until your caregiver says it is okay. °· Shower, wash your hair, and take tub baths as directed by your caregiver. °· Wear a well-fitting bra that provides breast support. °· Eat healthy foods. °· Drink enough fluids to keep your urine clear or pale yellow. °· Eat high-fiber foods such as whole grain cereals and breads, brown rice, beans, and fresh fruits and vegetables every day. These foods may help prevent or relieve constipation. °· Follow your cargiver's recommendations regarding resumption of activities such as climbing stairs, driving, lifting, exercising, or traveling. °· Talk to your caregiver about resuming sexual activities. Resumption of sexual activities is dependent upon your risk of infection, your rate of healing, and your comfort and desire to resume sexual activity. °· Try to have someone help you with your household activities and your newborn for at least a few days after you leave the hospital. °· Rest as much as possible. Try to rest or take a nap when your newborn is sleeping. °· Increase your activities gradually. °· Keep all  of your scheduled postpartum appointments. It is very important to keep your scheduled follow-up appointments. At these appointments, your caregiver will be checking to make sure that you are healing physically and emotionally. °SEEK MEDICAL CARE IF:  °· You are passing large clots from your vagina. Save any clots to show your caregiver. °· You have a foul smelling discharge from your vagina. °· You have trouble urinating. °· You are urinating frequently. °· You have pain when you urinate. °· You have a change in your bowel movements. °· You have increasing redness, pain, or swelling near your vaginal incision (episiotomy) or vaginal tear. °· You have pus draining from your episiotomy or vaginal tear. °· Your episiotomy or vaginal tear is separating. °· You have painful, hard, or reddened breasts. °· You have a severe headache. °· You have blurred vision or see spots. °· You feel sad or depressed. °· You have thoughts of hurting yourself or your newborn. °· You have questions about your care, the care of your newborn, or medicines. °· You are dizzy or lightheaded. °· You have a rash. °· You have nausea or vomiting. °· You were breastfeeding and have not had a menstrual period within 12 weeks after you stopped breastfeeding. °· You are not breastfeeding and have not had a menstrual period by the 12th week after delivery. °· You have a fever. °SEEK IMMEDIATE MEDICAL CARE IF:  °· You have persistent pain. °· You have chest pain. °· You have shortness of breath. °· You faint. °· You have leg pain. °· You have stomach pain. °· Your vaginal bleeding saturates two or more sanitary pads   in 1 hour. °MAKE SURE YOU:  °· Understand these instructions. °· Will watch your condition. °· Will get help right away if you are not doing well or get worse. °Document Released: 01/16/2000 Document Revised: 10/13/2011 Document Reviewed: 09/15/2011 °ExitCare® Patient Information ©2014 [employer]. ° °Postpartum Depression and Baby  Blues °The postpartum period begins right after the birth of a baby. During this time, there is often a great amount of joy and excitement. It is also a time of considerable changes in the life of the parent(s). Regardless of how many times a mother gives birth, each child brings new challenges and dynamics to the family. It is not unusual to have feelings of excitement accompanied by confusing shifts in moods, emotions, and thoughts. All mothers are at risk of developing postpartum depression or the "baby blues." These mood changes can occur right after giving birth, or they may occur many months after giving birth. The baby blues or postpartum depression can be mild or severe. Additionally, postpartum depression can resolve rather quickly, or it can be a long-term condition. °CAUSES °Elevated hormones and their rapid decline are thought to be a main cause of postpartum depression and the baby blues. There are a number of hormones that radically change during and after pregnancy. Estrogen and progesterone usually decrease immediately after delivering your baby. The level of thyroid hormone and various cortisol steroids also rapidly drop. Other factors that play a major role in these changes include major life events and genetics.  °RISK FACTORS °If you have any of the following risks for the baby blues or postpartum depression, know what symptoms to watch out for during the postpartum period. Risk factors that may increase the likelihood of getting the baby blues or postpartum depression include: °· Having a personal or family history of depression. °· Having depression while being pregnant. °· Having premenstrual or oral contraceptive-associated mood issues. °· Having exceptional life stress. °· Having marital conflict. °· Lacking a social support network. °· Having a baby with special needs. °· Having health problems such as diabetes. °SYMPTOMS °Baby blues symptoms include: °· Brief fluctuations in mood, such as  going from extreme happiness to sadness. °· Decreased concentration. °· Difficulty sleeping. °· Crying spells, tearfulness. °· Irritability. °· Anxiety. °Postpartum depression symptoms typically begin within the first month after giving birth. These symptoms include: °· Difficulty sleeping or excessive sleepiness. °· Marked weight loss. °· Agitation. °· Feelings of worthlessness. °· Lack of interest in activity or food. °Postpartum psychosis is a very concerning condition and can be dangerous. Fortunately, it is rare. Displaying any of the following symptoms is cause for immediate medical attention. Postpartum psychosis symptoms include: °· Hallucinations and delusions. °· Bizarre or disorganized behavior. °· Confusion or disorientation. °DIAGNOSIS  °A diagnosis is made by an evaluation of your symptoms. There are no medical or lab tests that lead to a diagnosis, but there are various questionnaires that a caregiver may use to identify those with the baby blues, postpartum depression, or psychosis. Often times, a screening tool called the Edinburgh Postnatal Depression Scale is used to diagnose depression in the postpartum period.  °TREATMENT °The baby blues usually goes away on its own in 1 to 2 weeks. Social support is often all that is needed. You should be encouraged to get adequate sleep and rest. Occasionally, you may be given medicines to help you sleep.  °Postpartum depression requires treatment as it can last several months or longer if it is not treated. Treatment may include individual or   group therapy, medicine, or both to address any social, physiological, and psychological factors that may play a role in the depression. Regular exercise, a healthy diet, rest, and social support may also be strongly recommended.  °Postpartum psychosis is more serious and needs treatment right away. Hospitalization is often needed. °HOME CARE INSTRUCTIONS °· Get as much rest as you can. Nap when the baby  sleeps. °· Exercise regularly. Some women find yoga and walking to be beneficial. °· Eat a balanced and nourishing diet. °· Do little things that you enjoy. Have a cup of tea, take a bubble bath, read your favorite magazine, or listen to your favorite music. °· Avoid alcohol. °· Ask for help with household chores, cooking, grocery shopping, or running errands as needed. Do not try to do everything. °· Talk to people close to you about how you are feeling. Get support from your partner, family members, friends, or other new moms. °· Try to stay positive in how you think. Think about the things you are grateful for. °· Do not spend a lot of time alone. °· Only take medicine as directed by your caregiver. °· Keep all your postpartum appointments. °· Let your caregiver know if you have any concerns. °SEEK MEDICAL CARE IF: °You are having a reaction or problems with your medicine. °SEEK IMMEDIATE MEDICAL CARE IF: °· You have suicidal feelings. °· You feel you may harm the baby or someone else. °Document Released: 10/23/2003 Document Revised: 04/12/2011 Document Reviewed: 10/30/2012 °ExitCare® Patient Information ©2014 [employer]. ° °

## 2013-03-27 NOTE — Lactation Note (Addendum)
This note was copied from the chart of Emily Fransico HimKaitlyn Narciso. Lactation Consultation Note  Patient Name: Emily David WGNFA'OToday's Date: 03/27/2013 Reason for consult: Follow-up assessment Baby has been breast with and without a nipple shield and bottle feeding EBM .  @ consult reviewed basics - breast massage , hand express, reverse pressure  Baby latched with assist and depth and fed 20 mins with multiply swallows and increased with breast  Compressions. Mom has large breast , so a rolled up blanket under breast helped with latch. Reviewed sore nipple tx and engorgement prevention and tx. Mom already has comfort gels for sore nipples  Small scabs on both . [facility] or [facility]. Per mom @consult  [facility] , plans to come back with money, Also has a hand pump at home. Since the baby latched without the nipple shield at consult and did well . LC gave mom the option of calling LC office back for F/U  LC O/P apt if using a nipple shield.  Maternal Data    Feeding Feeding Type: Breast Fed Nipple Type: Slow - flow Length of feed: 20 min (consistent pattern with multiply swallows )  LATCH Score/Interventions Latch: Repeated attempts needed to sustain latch, nipple held in mouth throughout feeding, stimulation needed to elicit sucking reflex. (without a nipple shield ) Intervention(s): Teach feeding cues;Waking techniques Intervention(s): Adjust position;Assist with latch;Breast massage;Breast compression  Audible Swallowing: Spontaneous and intermittent  Type of Nipple: Flat (swollen areolas ) Intervention(s): Shells;Reverse pressure;Hand pump ([facility] )  Comfort (Breast/Nipple): Filling, red/small blisters or bruises, mild/mod discomfort  Problem noted: Cracked, bleeding, blisters, bruises (left nipple scab noted ,intact )  Hold (Positioning): Assistance needed to  correctly position infant at breast and maintain latch. Intervention(s): Breastfeeding basics reviewed;Support Pillows;Position options;Skin to skin  LATCH Score: 6  Lactation Tools Discussed/Used Tools: Shells;Pump;Comfort gels Nipple shield size: 20;24 Breast pump type: Manual [facility] )   Consult Status Consult Status: Complete (not presently latching with nipple shield , see LC note )    Kathrin Greathouseorio, Lulia Schriner Ann 03/27/2013, 10:53 AM

## 2013-03-28 LAB — CULTURE, BLOOD (ROUTINE X 2)

## 2013-03-30 ENCOUNTER — Encounter (HOSPITAL_COMMUNITY): Payer: Self-pay | Admitting: *Deleted

## 2013-03-30 ENCOUNTER — Encounter (HOSPITAL_COMMUNITY)
Admission: AD | Disposition: A | Discharge status: Home / Self Care | Payer: Self-pay | Source: Ambulatory Visit | Attending: Obstetrics and Gynecology

## 2013-03-30 ENCOUNTER — Ambulatory Visit (HOSPITAL_COMMUNITY)
Admission: AD | Admit: 2013-03-30 | Discharge: 2013-03-31 | Disposition: A | Discharge status: Home / Self Care | Payer: Medicaid Other | Source: Ambulatory Visit | Attending: Obstetrics and Gynecology | Admitting: Obstetrics and Gynecology

## 2013-03-30 ENCOUNTER — Ambulatory Visit: Admit: 2013-03-30 | Payer: Self-pay | Admitting: Obstetrics and Gynecology

## 2013-03-30 DIAGNOSIS — O99215 Obesity complicating the puerperium: Secondary | ICD-10-CM

## 2013-03-30 DIAGNOSIS — E669 Obesity, unspecified: Secondary | ICD-10-CM | POA: Insufficient documentation

## 2013-03-30 DIAGNOSIS — O901 Disruption of perineal obstetric wound: Secondary | ICD-10-CM | POA: Insufficient documentation

## 2013-03-30 DIAGNOSIS — K219 Gastro-esophageal reflux disease without esophagitis: Secondary | ICD-10-CM | POA: Insufficient documentation

## 2013-03-30 DIAGNOSIS — Z6841 Body Mass Index (BMI) 40.0 and over, adult: Secondary | ICD-10-CM | POA: Insufficient documentation

## 2013-03-30 DIAGNOSIS — Z87891 Personal history of nicotine dependence: Secondary | ICD-10-CM | POA: Insufficient documentation

## 2013-03-30 HISTORY — PX: EXAMINATION UNDER ANESTHESIA: SHX1540

## 2013-03-30 LAB — CBC
HCT: 26 % — ABNORMAL LOW (ref 36.0–46.0)
HEMOGLOBIN: 8.6 g/dL — AB (ref 12.0–15.0)
MCH: 26.3 pg (ref 26.0–34.0)
MCHC: 33.1 g/dL (ref 30.0–36.0)
MCV: 79.5 fL (ref 78.0–100.0)
PLATELETS: 349 10*3/uL (ref 150–400)
RBC: 3.27 MIL/uL — ABNORMAL LOW (ref 3.87–5.11)
RDW: 15.1 % (ref 11.5–15.5)
WBC: 21.9 10*3/uL — ABNORMAL HIGH (ref 4.0–10.5)

## 2013-03-30 LAB — WET PREP, GENITAL
CLUE CELLS WET PREP: NONE SEEN
TRICH WET PREP: NONE SEEN
Yeast Wet Prep HPF POC: NONE SEEN

## 2013-03-30 LAB — GLUCOSE, CAPILLARY: Glucose-Capillary: 76 mg/dL (ref 70–99)

## 2013-03-30 SURGERY — EXAM UNDER ANESTHESIA
Anesthesia: Spinal | Site: Vagina

## 2013-03-30 MED ORDER — LIDOCAINE HCL (PF) 1 % IJ SOLN
30.0000 mL | Freq: Once | INTRAMUSCULAR | Status: DC
  Filled 2013-03-30: qty 30

## 2013-03-30 MED ORDER — FENTANYL CITRATE 0.05 MG/ML IJ SOLN
INTRAMUSCULAR | Status: AC
  Filled 2013-03-30: qty 2

## 2013-03-30 MED ORDER — FAMOTIDINE IN NACL 20-0.9 MG/50ML-% IV SOLN
20.0000 mg | Freq: Once | INTRAVENOUS | Status: AC
  Administered 2013-03-30: 20 mg via INTRAVENOUS
  Filled 2013-03-30: qty 50

## 2013-03-30 MED ORDER — MIDAZOLAM HCL 2 MG/2ML IJ SOLN
INTRAMUSCULAR | Status: AC
  Filled 2013-03-30: qty 2

## 2013-03-30 MED ORDER — OXYCODONE-ACETAMINOPHEN 5-325 MG PO TABS
2.0000 | ORAL_TABLET | Freq: Once | ORAL | Status: AC
  Administered 2013-03-30: 2 via ORAL
  Filled 2013-03-30: qty 2

## 2013-03-30 SURGICAL SUPPLY — 17 items
ELECT REM PT RETURN 9FT ADLT (ELECTROSURGICAL) ×3
ELECTRODE REM PT RTRN 9FT ADLT (ELECTROSURGICAL) ×1 IMPLANT
GLOVE SS BIOGEL STRL SZ 7 (GLOVE) ×2 IMPLANT
GLOVE SUPERSENSE BIOGEL SZ 7 (GLOVE) ×4
GOWN SURGICAL LARGE (GOWNS) ×3 IMPLANT
NS IRRIG 1000ML POUR BTL (IV SOLUTION) ×3 IMPLANT
PACK VAGINAL MINOR WOMEN LF (CUSTOM PROCEDURE TRAY) ×3 IMPLANT
PAD OB MATERNITY 4.3X12.25 (PERSONAL CARE ITEMS) ×3 IMPLANT
PENCIL BUTTON HOLSTER BLD 10FT (ELECTRODE) ×3 IMPLANT
SPONGE LAP 4X18 X RAY DECT (DISPOSABLE) ×6 IMPLANT
SUT VIC AB 2-0 CT1 (SUTURE) ×3 IMPLANT
SUT VIC AB 2-0 SH 27 (SUTURE) ×2
SUT VIC AB 2-0 SH 27XBRD (SUTURE) ×1 IMPLANT
TRAY FOLEY CATH 14FR (SET/KITS/TRAYS/PACK) ×3 IMPLANT
TUBING NON-CON 1/4 X 20 CONN (TUBING) ×2 IMPLANT
TUBING NON-CON 1/4 X 20' CONN (TUBING) ×1
YANKAUER SUCT BULB TIP NO VENT (SUCTIONS) ×3 IMPLANT

## 2013-03-30 NOTE — Discharge Summary (Signed)
Vaginal Delivery Discharge Summary  Emily David  DOB:    [birth year] MRN:    [id number] CSN:    [id number]  Date of admission:                  03/21/13  Date of discharge:                   03/27/13  Procedures this admission: SVD by J. Oxley, CNM, epidural, repair of R sulcus laceration by Dr Su Hilt, triple IV ABX,   Date of Delivery: 2//20/15   Newborn Data:  Live born female  Birth Weight: 6 lb 7.7 oz (2940 g) APGAR: 8, 9  Home with mother.   History of Present Illness:  [name] is a 23 y.o. female, G1P1001, who presents at [redacted]w[redacted]d weeks gestation. The patient has been followed at the [facility] and Gynecology division of [facility]. She was admitted induction of labor., secondary to GDM on insulin. Her pregnancy has been complicated by: NST, ultrasound and GDM.  Hospital course:  The patient was admitted for IOL. She rcv'd cytotec overnight, then pitocin the next day. Labor progressed well. She rcv'd an epidural.    Her labor was not complicated. She proceeded to have a vaginal delivery of a healthy infant. Her delivery was complicated by significant 2nd degree/R sulcus laceration. Her postpartum course was complicated by significant leukocytosis and fever. She rcv'd triple ABX, and IVF's. By PPD 3, WBC were decreasing and pt remained afebrible. Blood cultures were positive group A strep. H/H 8.7/26.4, without symptoms. She was started on PO iron.  She was discharged to home on postpartum day 4 doing well.  Feeding:  breast  Contraception:  oral progesterone-only contraceptive  Discharge hemoglobin:  Hemoglobin  Date Value Ref Range Status  03/27/2013 8.5* 12.0 - 15.0 g/dL Final     HCT  Date Value Ref Range Status  03/27/2013 25.3* 36.0 - 46.0 % Final    Discharge Physical Exam:   General: alert and no distress Lochia: appropriate Uterine Fundus: firm Incision: healing well DVT Evaluation: No evidence of DVT seen on  physical exam. Negative Homan's sign. No significant calf/ankle edema.  Intrapartum Procedures: spontaneous vaginal delivery and epidural  Postpartum Procedures: antibiotics Complications-Operative and Postpartum: anemia, 2nd degree/R sulcus laceration, PP fever, sepsis  Discharge Diagnoses: Term Pregnancy-delivered and sepsis  Discharge Information:  Activity:           pelvic rest Diet:                routine and iron rich Medications: PNV, Ibuprofen, Iron, Percocet and augmentionh Condition:      stable Instructions:   Postpartum Care After Vaginal Delivery  After you deliver your newborn (postpartum period), the usual stay in the hospital is 24 72 hours. If there were problems with your labor or delivery, or if you have other medical problems, you might be in the hospital longer.  While you are in the hospital, you will receive help and instructions on how to care for yourself and your newborn during the postpartum period.  While you are in the hospital:  Be sure to tell your nurses if you have pain or discomfort, as well as where you feel the pain and what makes the pain worse.  If you had an incision made near your vagina (episiotomy) or if you had some tearing during delivery, the nurses may put ice packs on your episiotomy or tear. The ice packs  may help to reduce the pain and swelling.  If you are breastfeeding, you may feel uncomfortable contractions of your uterus for a couple of weeks. This is normal. The contractions help your uterus get back to normal size.  It is normal to have some bleeding after delivery.  For the first 1 3 days after delivery, the flow is red and the amount may be similar to a period.  It is common for the flow to start and stop.  In the first few days, you may pass some small clots. Let your nurses know if you begin to pass large clots or your flow increases.  Do not  flush blood clots down the toilet before having the nurse look at  them.  During the next 3 10 days after delivery, your flow should become more watery and pink or brown-tinged in color.  Ten to fourteen days after delivery, your flow should be a small amount of yellowish-white discharge.  The amount of your flow will decrease over the first few weeks after delivery. Your flow may stop in 6 8 weeks. Most women have had their flow stop by 12 weeks after delivery.  You should change your sanitary pads frequently.  Wash your hands thoroughly with soap and water for at least 20 seconds after changing pads, using the toilet, or before holding or feeding your newborn.  You should feel like you need to empty your bladder within the first 6 8 hours after delivery.  In case you become weak, lightheaded, or faint, call your nurse before you get out of bed for the first time and before you take a shower for the first time.  Within the first few days after delivery, your breasts may begin to feel tender and full. This is called engorgement. Breast tenderness usually goes away within 48 72 hours after engorgement occurs. You may also notice milk leaking from your breasts. If you are not breastfeeding, do not stimulate your breasts. Breast stimulation can make your breasts produce more milk.  Spending as much time as possible with your newborn is very important. During this time, you and your newborn can feel close and get to know each other. Having your newborn stay in your room (rooming in) will help to strengthen the bond with your newborn. It will give you time to get to know your newborn and become comfortable caring for your newborn.  Your hormones change after delivery. Sometimes the hormone changes can temporarily cause you to feel sad or tearful. These feelings should not last more than a few days. If these feelings last longer than that, you should talk to your caregiver.  If desired, talk to your caregiver about methods of family planning or  contraception.  Talk to your caregiver about immunizations. Your caregiver may want you to have the following immunizations before leaving the hospital:  Tetanus, diphtheria, and pertussis (Tdap) or tetanus and diphtheria (Td) immunization. It is very important that you and your family (including grandparents) or others caring for your newborn are up-to-date with the Tdap or Td immunizations. The Tdap or Td immunization can help protect your newborn from getting ill.  Rubella immunization.  Varicella (chickenpox) immunization.  Influenza immunization. You should receive this annual immunization if you did not receive the immunization during your pregnancy. Document Released: 11/15/2006 Document Revised: 10/13/2011 Document Reviewed: 09/15/2011 [facility] Patient Information 2014 [place], [place].   Postpartum Depression and Baby Blues  The postpartum period begins right after the birth of a baby.  During this time, there is often a great amount of joy and excitement. It is also a time of considerable changes in the life of the parent(s). Regardless of how many times a mother gives birth, each child brings new challenges and dynamics to the family. It is not unusual to have feelings of excitement accompanied by confusing shifts in moods, emotions, and thoughts. All mothers are at risk of developing postpartum depression or the "baby blues." These mood changes can occur right after giving birth, or they may occur many months after giving birth. The baby blues or postpartum depression can be mild or severe. Additionally, postpartum depression can resolve rather quickly, or it can be a long-term condition. CAUSES Elevated hormones and their rapid decline are thought to be a main cause of postpartum depression and the baby blues. There are a number of hormones that radically change during and after pregnancy. Estrogen and progesterone usually decrease immediately after delivering your baby. The level of  thyroid hormone and various cortisol steroids also rapidly drop. Other factors that play a major role in these changes include major life events and genetics.  RISK FACTORS If you have any of the following risks for the baby blues or postpartum depression, know what symptoms to watch out for during the postpartum period. Risk factors that may increase the likelihood of getting the baby blues or postpartum depression include:  Havinga personal or family history of depression.  Having depression while being pregnant.  Having premenstrual or oral contraceptive-associated mood issues.  Having exceptional life stress.  Having marital conflict.  Lacking a social support network.  Having a baby with special needs.  Having health problems such as diabetes. SYMPTOMS Baby blues symptoms include:  Brief fluctuations in mood, such as going from extreme happiness to sadness.  Decreased concentration.  Difficulty sleeping.  Crying spells, tearfulness.  Irritability.  Anxiety. Postpartum depression symptoms typically begin within the first month after giving birth. These symptoms include:  Difficulty sleeping or excessive sleepiness.  Marked weight loss.  Agitation.  Feelings of worthlessness.  Lack of interest in activity or food. Postpartum psychosis is a very concerning condition and can be dangerous. Fortunately, it is rare. Displaying any of the following symptoms is cause for immediate medical attention. Postpartum psychosis symptoms include:  Hallucinations and delusions.  Bizarre or disorganized behavior.  Confusion or disorientation. DIAGNOSIS  A diagnosis is made by an evaluation of your symptoms. There are no medical or lab tests that lead to a diagnosis, but there are various questionnaires that a caregiver may use to identify those with the baby blues, postpartum depression, or psychosis. Often times, a screening tool called the [place] Postnatal Depression Scale  is used to diagnose depression in the postpartum period.  TREATMENT The baby blues usually goes away on its own in 1 to 2 weeks. Social support is often all that is needed. You should be encouraged to get adequate sleep and rest. Occasionally, you may be given medicines to help you sleep.  Postpartum depression requires treatment as it can last several months or longer if it is not treated. Treatment may include individual or group therapy, medicine, or both to address any social, physiological, and psychological factors that may play a role in the depression. Regular exercise, a healthy diet, rest, and social support may also be strongly recommended.  Postpartum psychosis is more serious and needs treatment right away. Hospitalization is often needed. HOME CARE INSTRUCTIONS  Get as much rest as you can. Nap when  the baby sleeps.  Exercise regularly. Some women find yoga and walking to be beneficial.  Eat a balanced and nourishing diet.  Do little things that you enjoy. Have a cup of tea, take a bubble bath, read your favorite magazine, or listen to your favorite music.  Avoid alcohol.  Ask for help with household chores, cooking, grocery shopping, or running errands as needed. Do not try to do everything.  Talk to people close to you about how you are feeling. Get support from your partner, family members, friends, or other new moms.  Try to stay positive in how you think. Think about the things you are grateful for.  Do not spend a lot of time alone.  Only take medicine as directed by your caregiver.  Keep all your postpartum appointments.  Let your caregiver know if you have any concerns. SEEK MEDICAL CARE IF: You are having a reaction or problems with your medicine. SEEK IMMEDIATE MEDICAL CARE IF:  You have suicidal feelings.  You feel you may harm the baby or someone else. Document Released: 10/23/2003 Document Revised: 04/12/2011 Document Reviewed: 11/24/2010 [facility]  Patient Information 2014 [place], [place].   Discharge to: home     Manasa Spease M 03/30/2013

## 2013-03-30 NOTE — MAU Provider Note (Signed)
History     CSN: [id number]  Arrival date and time: 03/30/13 [id number]   First Provider Initiated Contact with Patient 03/30/13 2038      Chief Complaint  Patient presents with  . Vaginal Discharge   HPI Comments: Pt is a 23yo G1P1 s/p SVD on 2/20, arrives w c/o significant green discharge and increasing vaginal pain at incision. Also reports temperature at home as high as 100.4, but reduces to 99 if takes ibuprofen. Hx significant for, deep right sulcas laceration repaired by Dr Su Hiltoberts. On PPD1 pt developed significant fever and leukocytosis w bandemia, She was tx to AICU and given triple abx and IVF's, fever improved and WBC decreased. She was dc'd home on PPD 4 w augmentin BID.  Pt reports scant lochia prior to discharge that began yesterday.  Had BM yesterday. Pt is pumping and feeding infant w brst milk.    Vaginal Discharge The patient's primary symptoms include a vaginal discharge. Associated symptoms include a fever.      Past Medical History  Diagnosis Date  . Mood swings   . Boil   . Bartholin cyst   . Congenital absence of one kidney   . Asthma     winter related when sick  . Mental disorder     hx of bipolar  . Gestational diabetes mellitus, currently pregnant     Past Surgical History  Procedure Laterality Date  . Nephrectomy  1991    left   . Adenoidectomy    . Tonsillectomy      Family History  Problem Relation Age of Onset  . Kidney disease Maternal Grandmother   . Cancer Maternal Grandmother   . Diabetes Maternal Grandfather     History  Substance Use Topics  . Smoking status: Former Smoker -- 0.50 packs/day for 1 years    Types: Cigarettes    Quit date: 01/21/2013  . Smokeless tobacco: Never Used  . Alcohol Use: No     Comment: occausional    Allergies: No Known Allergies  Prescriptions prior to admission  Medication Sig Dispense Refill  . acetaminophen (TYLENOL) 500 MG tablet Take 1,000 mg by mouth every 6 (six) hours as needed. For  pain.      Marland Kitchen. albuterol (PROVENTIL HFA;VENTOLIN HFA) 108 (90 BASE) MCG/ACT inhaler Inhale 1-2 puffs into the lungs every 6 (six) hours as needed for wheezing or shortness of breath.      Marland Kitchen. amoxicillin-clavulanate (AUGMENTIN) 875-125 MG per tablet Take 1 tablet by mouth every 12 (twelve) hours.  28 tablet  0  . benzocaine-Menthol (DERMOPLAST) 20-0.5 % AERO Apply 1 application topically 4 (four) times daily as needed for irritation.      Marland Kitchen. ibuprofen (ADVIL,MOTRIN) 600 MG tablet Take 1 tablet (600 mg total) by mouth every 6 (six) hours.  30 tablet  0  . iron polysaccharides (NIFEREX) 150 MG capsule Take 1 capsule (150 mg total) by mouth 2 (two) times daily with a meal.  60 capsule  2  . oxyCODONE-acetaminophen (PERCOCET/ROXICET) 5-325 MG per tablet Take 1-2 tablets by mouth every 4 (four) hours as needed for severe pain (moderate - severe pain).  30 tablet  0  . pantoprazole (PROTONIX) 40 MG tablet Take 40 mg by mouth daily.      . prenatal vitamin w/FE, FA (NATACHEW) 29-1 MG CHEW Chew 1 tablet by mouth daily at 12 noon.      Marland Kitchen. witch hazel-glycerin (TUCKS) pad Apply 1 application topically as needed for itching.  Review of Systems  Constitutional: Positive for fever.  Genitourinary: Positive for vaginal discharge.       Vaginal pain, and discharge   Psychiatric/Behavioral: The patient is nervous/anxious.    Physical Exam   Blood pressure 138/81, pulse 101, temperature 99 F (37.2 C), temperature source Oral, resp. rate 20, last menstrual period 06/21/2012, unknown if currently breastfeeding.  Physical Exam  Nursing note and vitals reviewed. Constitutional: She is oriented to person, place, and time. She appears well-developed. She appears distressed.  Anxious and mildly distressed   HENT:  Head: Normocephalic.  Eyes: Pupils are equal, round, and reactive to light.  Neck: Normal range of motion.  Cardiovascular: Normal rate.   Slightly tachycardia   Respiratory: Effort normal.   Genitourinary: Vaginal discharge found.  Significant green discharge without odor, stitch appears hanging outside vagina, Pt did not tolerate exam  Musculoskeletal: Normal range of motion.  Neurological: She is alert and oriented to person, place, and time.  Skin: Skin is warm and dry.  Psychiatric: She has a normal mood and affect. Her behavior is normal.    MAU Course  Procedures   Assessment and Plan  Pt is s/p SVD, PPD8 Low grade temperature  Appears to have dehiscence of 2nd degree sulcus laceration   Wet prep sent Dr Su Hilt to see 2 tabs percocet given PO   LILLARD,SHELLEY M 03/30/2013, 9:58 PM   Agree with above.  Pt unable to be examined in MAU.  Will take to OR for thorough exam and repair as needed.

## 2013-03-30 NOTE — MAU Note (Signed)
Patient is taking Augmentin bid

## 2013-03-30 NOTE — MAU Note (Signed)
Patient states she had a vaginal delivery on 2-20. States she had an extensive laceration with repair. States she got some kind of infection and was in  AICU for 3 days. States she has a yellow discharge that is coming from the stitches for 3 days.

## 2013-03-30 NOTE — MAU Note (Signed)
Patient states she had a fever of 100 to 100.3 and is taking Tylenol and Ibuprofen that will bring it down the it goes back up. States she is having a lot of pain.

## 2013-03-30 NOTE — Anesthesia Preprocedure Evaluation (Signed)
Anesthesia Evaluation  Patient identified by MRN, date of birth, ID band Patient awake    Reviewed: Allergy & Precautions, H&P , NPO status , Patient's Chart, lab work & pertinent test results  History of Anesthesia Complications Negative for: history of anesthetic complications  Airway Mallampati: III TM Distance: >3 FB Neck ROM: full    Dental  (+) Teeth Intact, Poor Dentition   Pulmonary asthma (last inhaler use before Christmas, only when she is very sick) , former smoker,  breath sounds clear to auscultation        Cardiovascular negative cardio ROS  Rhythm:regular Rate:Normal     Neuro/Psych PSYCHIATRIC DISORDERS (bipolar) negative neurological ROS     GI/Hepatic Neg liver ROS, GERD-  Medicated,  Endo/Other  diabetes, Gestational, Insulin DependentMorbid obesity  Renal/GU 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     Musculoskeletal   Abdominal (+) + obese,   Peds  Hematology negative hematology ROS (+)   Anesthesia Other Findings   Reproductive/Obstetrics                           Anesthesia Physical  Anesthesia Plan  ASA: III  Anesthesia Plan: Spinal   Post-op Pain Management:    Induction:   Airway Management Planned:   Additional Equipment:   Intra-op Plan:   Post-operative Plan:   Informed Consent: I have reviewed the patients History and Physical, chart, labs and discussed the procedure including the risks, benefits and alternatives for the proposed anesthesia with the patient or authorized representative who has indicated his/her understanding and acceptance.     Plan Discussed with: CRNA and Surgeon  Anesthesia Plan Comments:         Anesthesia Quick Evaluation

## 2013-03-30 NOTE — Anesthesia Preprocedure Evaluation (Deleted)
Anesthesia Evaluation    Airway       Dental   Pulmonary former smoker,          Cardiovascular     Neuro/Psych    GI/Hepatic   Endo/Other    Renal/GU      Musculoskeletal   Abdominal   Peds  Hematology   Anesthesia Other Findings   Reproductive/Obstetrics                          Anesthesia Physical Anesthesia Plan Anesthesia Quick Evaluation                                  Anesthesia Evaluation  Patient identified by MRN, date of birth, ID band Patient awake    Reviewed: Allergy & Precautions, H&P , NPO status , Patient's Chart, lab work & pertinent test results, reviewed documented beta blocker date and time   History of Anesthesia Complications Negative for: history of anesthetic complications  Airway Mallampati: III TM Distance: >3 FB Neck ROM: full    Dental  (+) Teeth Intact, Poor Dentition   Pulmonary asthma (last inhaler use before Christmas, only when she is very sick) , former smoker,  breath sounds clear to auscultation        Cardiovascular negative cardio ROS  Rhythm:regular Rate:Normal     Neuro/Psych PSYCHIATRIC DISORDERS (bipolar) negative neurological ROS     GI/Hepatic negative GI ROS, Neg liver ROS, GERD-  Medicated,  Endo/Other  diabetes, Gestational, Insulin DependentMorbid obesity  Renal/GU 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     Musculoskeletal   Abdominal   Peds  Hematology negative hematology ROS (+)   Anesthesia Other Findings   Reproductive/Obstetrics (+) Pregnancy                           Anesthesia Physical Anesthesia Plan  ASA: III  Anesthesia Plan: Epidural   Post-op Pain Management:    Induction:   Airway Management Planned:   Additional Equipment:   Intra-op Plan:   Post-operative Plan:   Informed Consent: I have reviewed the  patients History and Physical, chart, labs and discussed the procedure including the risks, benefits and alternatives for the proposed anesthesia with the patient or authorized representative who has indicated his/her understanding and acceptance.     Plan Discussed with:   Anesthesia Plan Comments:         Anesthesia Quick Evaluation                                   Anesthesia Evaluation  Patient identified by MRN, date of birth, ID band Patient awake    Reviewed: Allergy & Precautions, H&P , NPO status , Patient's Chart, lab work & pertinent test results, reviewed documented beta blocker date and time   History of Anesthesia Complications Negative for: history of anesthetic complications  Airway Mallampati: III TM Distance: >3 FB Neck ROM: full    Dental  (+) Teeth Intact, Poor Dentition   Pulmonary asthma (last inhaler use before Christmas, only when she is very sick) , former smoker,  breath sounds clear to auscultation        Cardiovascular negative cardio ROS  Rhythm:regular Rate:Normal     Neuro/Psych PSYCHIATRIC  DISORDERS (bipolar) negative neurological ROS     GI/Hepatic negative GI ROS, Neg liver ROS, GERD-  Medicated,  Endo/Other  diabetes, Gestational, Insulin DependentMorbid obesity  Renal/GU 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     Musculoskeletal   Abdominal   Peds  Hematology negative hematology ROS (+)   Anesthesia Other Findings   Reproductive/Obstetrics (+) Pregnancy                           Anesthesia Physical Anesthesia Plan  ASA: III  Anesthesia Plan: Epidural   Post-op Pain Management:    Induction:   Airway Management Planned:   Additional Equipment:   Intra-op Plan:   Post-operative Plan:   Informed Consent: I have reviewed the patients History and Physical, chart, labs and discussed the procedure including the risks, benefits  and alternatives for the proposed anesthesia with the patient or authorized representative who has indicated his/her understanding and acceptance.     Plan Discussed with:   Anesthesia Plan Comments:         Anesthesia Quick Evaluation

## 2013-03-31 ENCOUNTER — Inpatient Hospital Stay (HOSPITAL_COMMUNITY): Payer: Medicaid Other | Admitting: Anesthesiology

## 2013-03-31 ENCOUNTER — Encounter (HOSPITAL_COMMUNITY): Payer: Medicaid Other | Admitting: Anesthesiology

## 2013-03-31 LAB — DIFFERENTIAL
BAND NEUTROPHILS: 0 % (ref 0–10)
BASOS PCT: 0 % (ref 0–1)
BLASTS: 0 %
Basophils Absolute: 0 10*3/uL (ref 0.0–0.1)
EOS ABS: 0.2 10*3/uL (ref 0.0–0.7)
EOS PCT: 1 % (ref 0–5)
LYMPHS ABS: 9.4 10*3/uL — AB (ref 0.7–4.0)
LYMPHS PCT: 43 % (ref 12–46)
METAMYELOCYTES PCT: 0 %
Monocytes Absolute: 0.9 10*3/uL (ref 0.1–1.0)
Monocytes Relative: 4 % (ref 3–12)
Myelocytes: 0 %
Neutro Abs: 11.4 10*3/uL — ABNORMAL HIGH (ref 1.7–7.7)
Neutrophils Relative %: 52 % (ref 43–77)
Promyelocytes Absolute: 0 %
nRBC: 0 /100 WBC

## 2013-03-31 LAB — CULTURE, BLOOD (SINGLE): Culture: NO GROWTH

## 2013-03-31 MED ORDER — FENTANYL CITRATE 0.05 MG/ML IJ SOLN
INTRAMUSCULAR | Status: DC | PRN
  Administered 2013-03-31 (×2): 50 ug via INTRAVENOUS

## 2013-03-31 MED ORDER — OXYCODONE-ACETAMINOPHEN 5-325 MG PO TABS: 1.0000 | ORAL_TABLET | Freq: Four times a day (QID) | ORAL | Status: DC | PRN

## 2013-03-31 MED ORDER — KETOROLAC TROMETHAMINE 30 MG/ML IJ SOLN: 15.0000 mg | Freq: Once | INTRAMUSCULAR | Status: DC | PRN

## 2013-03-31 MED ORDER — MIDAZOLAM HCL 5 MG/5ML IJ SOLN
INTRAMUSCULAR | Status: DC | PRN
  Administered 2013-03-31 (×2): 1 mg via INTRAVENOUS

## 2013-03-31 MED ORDER — ONDANSETRON HCL 4 MG/2ML IJ SOLN: 4.0000 mg | Freq: Once | INTRAMUSCULAR | Status: DC | PRN

## 2013-03-31 MED ORDER — MEPERIDINE HCL 25 MG/ML IJ SOLN: 6.2500 mg | INTRAMUSCULAR | Status: DC | PRN

## 2013-03-31 MED ORDER — BUPIVACAINE IN DEXTROSE 0.75-8.25 % IT SOLN
INTRATHECAL | Status: DC | PRN
  Administered 2013-03-31: 10.5 mg via INTRATHECAL

## 2013-03-31 MED ORDER — LACTATED RINGERS IV SOLN
INTRAVENOUS | Status: DC | PRN
  Administered 2013-03-31: via INTRAVENOUS

## 2013-03-31 MED ORDER — CIPROFLOXACIN IN D5W 400 MG/200ML IV SOLN
400.0000 mg | Freq: Once | INTRAVENOUS | Status: AC
  Administered 2013-03-31: 400 mg via INTRAVENOUS
  Filled 2013-03-31: qty 200

## 2013-03-31 MED ORDER — METRONIDAZOLE IN NACL 5-0.79 MG/ML-% IV SOLN
500.0000 mg | Freq: Three times a day (TID) | INTRAVENOUS | Status: DC
  Administered 2013-03-31: .5 g via INTRAVENOUS
  Filled 2013-03-31 (×4): qty 100

## 2013-03-31 MED ORDER — CEPHALEXIN 500 MG PO CAPS: 500.0000 mg | ORAL_CAPSULE | Freq: Two times a day (BID) | ORAL | Status: DC

## 2013-03-31 MED ORDER — FENTANYL CITRATE 0.05 MG/ML IJ SOLN
25.0000 ug | INTRAMUSCULAR | Status: DC | PRN
Start: 2013-03-31 — End: 2013-03-31

## 2013-03-31 NOTE — Anesthesia Procedure Notes (Signed)
Spinal  Patient location during procedure: OR Start time: 03/31/2013 12:17 AM End time: 03/31/2013 12:24 AM Staffing Anesthesiologist: Leilani AbleHATCHETT, Zahki Hoogendoorn Performed by: anesthesiologist  Preanesthetic Checklist Completed: patient identified, site marked, surgical consent, pre-op evaluation, timeout performed, IV checked, risks and benefits discussed and monitors and equipment checked Spinal Block Patient position: sitting Prep: DuraPrep Patient monitoring: heart rate, cardiac monitor, continuous pulse ox and blood pressure Approach: midline Location: L3-4 Injection technique: single-shot Needle Needle type: Pencan  Needle gauge: 24 G Needle length: 9 cm Needle insertion depth: 7 cm Assessment Sensory level: T10

## 2013-03-31 NOTE — Discharge Instructions (Signed)
Check temperature every 6 hours and if greater than or equal to 100.4, notify doctor to be seen and evaluated Call with abnormal discharge or notice of suture dangling Call with increased pain Call with heavy bleeding Nothing in vagina No heavy lilfting (greater than 10-15 lbs) Antibiotic prescription given to be started after completion of current antibiotic

## 2013-03-31 NOTE — Op Note (Addendum)
Preop Diagnosis:  vaginal wound dehiscence      Postop Diagnosis:  vaginal wound dehiscence     Procedure: 1.EXAM UNDER ANESTHESIA 2.REPAIR OF VAGINAL WOUND DEHISCENCE  Anesthesia: Spinal  Anesthesiologist: Leilani AbleFranklin Hatchett, MD   Attending: Osborn CohoAngela Abhijot Straughter. MD   Assistant: N/a  Findings: N/a  Pathology: N/a  Fluids: See flowsheet  UOP: See flowsheet  EBL: See flowsheet  Complications: None  Procedure: Patient was taken to the OR after r/b/a discussed, patient verbalized understanding and consent signed and witnessed.  Patient was given a spinal per anethesiologist and preppred and draped in the normal sterile fashion.  Yellow discharge initially noted and sent for culture.  Area inspected and dehiscence noted. Edges were cut a little to aid healing.  Laceration was then repaired with 2-0 vicryl via interlocking stitch in vagina running on the right labia.  A second running stitch of 2-0 vicryl was done along the length of the laceration.  Betadine applied again and good hemostasis noted.  Sponge, lap and needle count was correct.  Patient tolerated the procedure well and was returned to the recovery room in good condition.

## 2013-03-31 NOTE — H&P (Addendum)
History      CSN: [id number]   Arrival date and time: 03/30/13 [id number]    First Provider Initiated Contact with Patient 03/30/13 2038          Chief Complaint   Patient presents with   .  Vaginal Discharge    HPI Comments: Pt is a 23yo G1P1 s/p SVD on 2/20, arrives w c/o significant green discharge and increasing vaginal pain at incision. Also reports temperature at home as high as 100.4, but reduces to 99 if takes ibuprofen. Hx significant for, deep right sulcas laceration repaired by Dr Su Hiltoberts. On PPD1 pt developed significant fever and leukocytosis w bandemia, She was tx to AICU and given triple abx and IVF's, fever improved and WBC decreased. She was dc'd home on PPD 4 w augmentin BID.   Pt reports scant lochia prior to discharge that began yesterday.   Had BM yesterday. Pt is pumping and feeding infant w brst milk.     Vaginal Discharge The patient's primary symptoms include a vaginal discharge. Associated symptoms include a fever.         Past Medical History   Diagnosis  Date   .  Mood swings     .  Boil     .  Bartholin cyst     .  Congenital absence of one kidney     .  Asthma         winter related when sick   .  Mental disorder         hx of bipolar   .  Gestational diabetes mellitus, currently pregnant         Past Surgical History   Procedure  Laterality  Date   .  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  Adenoidectomy       .  Tonsillectomy           Family History   Problem  Relation  Age of Onset   .  Kidney disease  Maternal Grandmother     .  Cancer  Maternal Grandmother     .  Diabetes  Maternal Grandfather         History   Substance Use Topics   .  Smoking status:  Former Smoker -- 0.50 packs/day for 1 years       Types:  Cigarettes       Quit date:  01/21/2013   .  Smokeless tobacco:  Never Used   .  Alcohol Use:  No         Comment: occausional      Allergies: No Known Allergies    Prescriptions prior to admission   Medication   Sig  Dispense  Refill   .  acetaminophen (TYLENOL) 500 MG tablet  Take 1,000 mg by mouth every 6 (six) hours as needed. For pain.         Marland Kitchen.  albuterol (PROVENTIL HFA;VENTOLIN HFA) 108 (90 BASE) MCG/ACT inhaler  Inhale 1-2 puffs into the lungs every 6 (six) hours as needed for wheezing or shortness of breath.         Marland Kitchen.  amoxicillin-clavulanate (AUGMENTIN) 875-125 MG per tablet  Take 1 tablet by mouth every 12 (twelve) hours.   28 tablet   0   .  benzocaine-Menthol (DERMOPLAST) 20-0.5 % AERO  Apply 1 application topically 4 (four) times daily as needed for irritation.         .Marland Kitchen  ibuprofen (ADVIL,MOTRIN) 600 MG tablet  Take 1 tablet (600 mg total) by mouth every 6 (six) hours.   30 tablet   0   .  iron polysaccharides (NIFEREX) 150 MG capsule  Take 1 capsule (150 mg total) by mouth 2 (two) times daily with a meal.   60 capsule   2   .  oxyCODONE-acetaminophen (PERCOCET/ROXICET) 5-325 MG per tablet  Take 1-2 tablets by mouth every 4 (four) hours as needed for severe pain (moderate - severe pain).   30 tablet   0   .  pantoprazole (PROTONIX) 40 MG tablet  Take 40 mg by mouth daily.         .  prenatal vitamin w/FE, FA (NATACHEW) 29-1 MG CHEW  Chew 1 tablet by mouth daily at 12 noon.         Marland Kitchen  witch hazel-glycerin (TUCKS) pad  Apply 1 application topically as needed for itching.            Review of Systems  Constitutional: Positive for fever.  Genitourinary: Positive for vaginal discharge.        Vaginal pain, and discharge   Psychiatric/Behavioral: The patient is nervous/anxious.   Physical Exam      Blood pressure 138/81, pulse 101, temperature 99 F (37.2 C), temperature source Oral, resp. rate 20, last menstrual period 06/21/2012, unknown if currently breastfeeding.   Physical Exam  Nursing note and vitals reviewed. Constitutional: She is oriented to person, place, and time. She appears well-developed. She appears distressed.  Anxious and mildly distressed   HENT:   Head:  Normocephalic.  Eyes: Pupils are equal, round, and reactive to light.  Neck: Normal range of motion.  Cardiovascular: Normal rate.   Slightly tachycardia   Respiratory: Effort normal.  Genitourinary: Vaginal discharge found.  Significant green discharge without odor, stitch appears hanging outside vagina, Pt did not tolerate exam  Musculoskeletal: Normal range of motion.  Neurological: She is alert and oriented to person, place, and time.  Skin: Skin is warm and dry.  Psychiatric: She has a normal mood and affect. Her behavior is normal.     MAU Course    Procedures      Assessment and Plan    Pt is s/p SVD, PPD8 Low grade temperature   Appears to have dehiscence of 2nd degree sulcus laceration    Wet prep sent Dr Su Hilt to see 2 tabs percocet given PO    LILLARD,SHELLEY M 03/30/2013, 9:58 PM    Agree with above.  Pt unable to be examined in MAU.  Will take to OR for thorough exam and repair as needed.  Revision History...     Date/Time User Action   03/30/2013 11:00 PM Purcell Nails, MD Sign   03/30/2013 10:12 PM Malissa Hippo, CNM Sign  View Details Report     Pt would like to go home immediately after recovery.  D/c instructions discussed with patient and mother.  Pt instructed to f/u in office next week for eval and call with any problems especially elevated temp as per the discharge paperwork.

## 2013-03-31 NOTE — Anesthesia Postprocedure Evaluation (Signed)
Anesthesia Post Note  Patient: Emily David  Procedure(s) Performed: Procedure(s) (LRB): EXAM UNDER ANESTHESIA (N/A)  Anesthesia type: Spinal  Patient location: PACU  Post pain: Pain level controlled  Post assessment: Post-op Vital signs reviewed  Last Vitals:  Filed Vitals:   03/31/13 0118  BP: 118/79  Pulse: 84  Temp: 36.8 C  Resp: 13    Post vital signs: Reviewed  Level of consciousness: awake  Complications: No apparent anesthesia complications

## 2013-03-31 NOTE — Transfer of Care (Signed)
Immediate Anesthesia Transfer of Care Note  Patient: Emily David  Procedure(s) Performed: Procedure(s): EXAM UNDER ANESTHESIA (N/A)  Patient Location: PACU  Anesthesia Type:Spinal  Level of Consciousness: awake, alert , oriented and patient cooperative  Airway & Oxygen Therapy: Patient Spontanous Breathing  Post-op Assessment: Report given to PACU RN and Post -op Vital signs reviewed and stable  Post vital signs: Reviewed and stable  Complications: No apparent anesthesia complications

## 2013-04-02 ENCOUNTER — Encounter (HOSPITAL_COMMUNITY): Payer: Self-pay | Admitting: Obstetrics and Gynecology

## 2013-04-02 LAB — WOUND CULTURE: Special Requests: NORMAL

## 2013-04-12 ENCOUNTER — Encounter (HOSPITAL_COMMUNITY): Payer: Medicaid Other

## 2013-04-20 ENCOUNTER — Ambulatory Visit (HOSPITAL_COMMUNITY): Admission: RE | Admit: 2013-04-20 | Payer: Medicaid Other | Source: Ambulatory Visit

## 2013-05-12 NOTE — Discharge Summary (Signed)
Vaginal Delivery Discharge Summary  Emily David  DOB: January 05, 1991  MRN: [id number]  CSN: [id number]  Date of admission: 03/21/13  Date of discharge: 03/27/13  Procedures this admission: SVD by J. Oxley, CNM, epidural, repair of R sulcus laceration by Dr Su Hilt, triple IV ABX,  Date of Delivery: 2//20/15  Newborn Data:  Live born female  Birth Weight: 6 lb 7.7 oz (2940 g)  APGAR: 8, 9  Home with mother.  History of Present Illness:  [name] is a 23 y.o. female, G1P1001, who presents at [redacted]w[redacted]d weeks gestation. The patient has been followed at the [facility] and Gynecology division of [facility]. She was admitted induction of labor., secondary to GDM on insulin. Her pregnancy has been complicated by: NST, ultrasound and GDM.  Hospital course:  The patient was admitted for IOL. She rcv'd cytotec overnight, then pitocin the next day. Labor progressed well. She rcv'd an epidural. Her labor was not complicated. She proceeded to have a vaginal delivery of a healthy infant. Her delivery was complicated by significant 2nd degree/R sulcus laceration. Her postpartum course was complicated by significant leukocytosis and fever. She rcv'd triple ABX, and IVF's. By PPD 3, WBC were decreasing and pt remained afebrible. Blood cultures were positive group A strep. H/H 8.7/26.4, without symptoms. She was started on PO iron. She was discharged to home on postpartum day 4 doing well.  Feeding:  breast  Contraception:  oral progesterone-only contraceptive  Discharge hemoglobin:  Hemoglobin   Date  Value  Ref Range  Status   03/27/2013  8.5*  12.0 - 15.0 g/dL  Final      HCT   Date  Value  Ref Range  Status   03/27/2013  25.3*  36.0 - 46.0 %  Final    Discharge Physical Exam:  General: alert and no distress  Lochia: appropriate  Uterine Fundus: firm  Incision: healing well  DVT Evaluation: No evidence of DVT seen on physical exam.  Negative Homan's sign.  No  significant calf/ankle edema.  Intrapartum Procedures: spontaneous vaginal delivery and epidural  Postpartum Procedures: antibiotics  Complications-Operative and Postpartum: anemia, 2nd degree/R sulcus laceration, PP fever, sepsis  Discharge Diagnoses: Term Pregnancy-delivered and sepsis  Discharge Information:  Activity: pelvic rest  Diet: routine and iron rich  Medications: PNV, Ibuprofen, Iron, Percocet and augmentionh  Condition: stable  Instructions:  Postpartum Care After Vaginal Delivery  After you deliver your newborn (postpartum period), the usual stay in the hospital is 24 72 hours. If there were problems with your labor or delivery, or if you have other medical problems, you might be in the hospital longer.  While you are in the hospital, you will receive help and instructions on how to care for yourself and your newborn during the postpartum period.  While you are in the hospital:  Be sure to tell your nurses if you have pain or discomfort, as well as where you feel the pain and what makes the pain worse.  If you had an incision made near your vagina (episiotomy) or if you had some tearing during delivery, the nurses may put ice packs on your episiotomy or tear. The ice packs may help to reduce the pain and swelling.  If you are breastfeeding, you may feel uncomfortable contractions of your uterus for a couple of weeks. This is normal. The contractions help your uterus get back to normal size.  It is normal to have some bleeding after delivery.  For the  first 1 3 days after delivery, the flow is red and the amount may be similar to a period.  It is common for the flow to start and stop.  In the first few days, you may pass some small clots. Let your nurses know if you begin to pass large clots or your flow increases.  Do not flush blood clots down the toilet before having the nurse look at them.  During the next 3 10 days after delivery, your flow should become more watery and pink  or brown-tinged in color.  Ten to fourteen days after delivery, your flow should be a small amount of yellowish-white discharge.  The amount of your flow will decrease over the first few weeks after delivery. Your flow may stop in 6 8 weeks. Most women have had their flow stop by 12 weeks after delivery.  You should change your sanitary pads frequently.  Wash your hands thoroughly with soap and water for at least 20 seconds after changing pads, using the toilet, or before holding or feeding your newborn.  You should feel like you need to empty your bladder within the first 6 8 hours after delivery.  In case you become weak, lightheaded, or faint, call your nurse before you get out of bed for the first time and before you take a shower for the first time.  Within the first few days after delivery, your breasts may begin to feel tender and full. This is called engorgement. Breast tenderness usually goes away within 48 72 hours after engorgement occurs. You may also notice milk leaking from your breasts. If you are not breastfeeding, do not stimulate your breasts. Breast stimulation can make your breasts produce more milk.  Spending as much time as possible with your newborn is very important. During this time, you and your newborn can feel close and get to know each other. Having your newborn stay in your room (rooming in) will help to strengthen the bond with your newborn. It will give you time to get to know your newborn and become comfortable caring for your newborn.  Your hormones change after delivery. Sometimes the hormone changes can temporarily cause you to feel sad or tearful. These feelings should not last more than a few days. If these feelings last longer than that, you should talk to your caregiver.  If desired, talk to your caregiver about methods of family planning or contraception.  Talk to your caregiver about immunizations. Your caregiver may want you to have the following immunizations  before leaving the hospital:  Tetanus, diphtheria, and pertussis (Tdap) or tetanus and diphtheria (Td) immunization. It is very important that you and your family (including grandparents) or others caring for your newborn are up-to-date with the Tdap or Td immunizations. The Tdap or Td immunization can help protect your newborn from getting ill.  Rubella immunization.  Varicella (chickenpox) immunization.  Influenza immunization. You should receive this annual immunization if you did not receive the immunization during your pregnancy. Document Released: 11/15/2006 Document Revised: 10/13/2011 Document Reviewed: 09/15/2011  [facility] Patient Information 2014 [place], [place].  Postpartum Depression and Baby Blues  The postpartum period begins right after the birth of a baby. During this time, there is often a great amount of joy and excitement. It is also a time of considerable changes in the life of the parent(s). Regardless of how many times a mother gives birth, each child brings new challenges and dynamics to the family. It is not unusual to have feelings  of excitement accompanied by confusing shifts in moods, emotions, and thoughts. All mothers are at risk of developing postpartum depression or the "baby blues." These mood changes can occur right after giving birth, or they may occur many months after giving birth. The baby blues or postpartum depression can be mild or severe. Additionally, postpartum depression can resolve rather quickly, or it can be a long-term condition.  CAUSES  Elevated hormones and their rapid decline are thought to be a main cause of postpartum depression and the baby blues. There are a number of hormones that radically change during and after pregnancy. Estrogen and progesterone usually decrease immediately after delivering your baby. The level of thyroid hormone and various cortisol steroids also rapidly drop. Other factors that play a major role in these changes include major  life events and genetics.  RISK FACTORS  If you have any of the following risks for the baby blues or postpartum depression, know what symptoms to watch out for during the postpartum period. Risk factors that may increase the likelihood of getting the baby blues or postpartum depression include:  Having a personal or family history of depression.  Having depression while being pregnant.  Having premenstrual or oral contraceptive-associated mood issues.  Having exceptional life stress.  Having marital conflict.  Lacking a social support network.  Having a baby with special needs.  Having health problems such as diabetes. SYMPTOMS  Baby blues symptoms include:  Brief fluctuations in mood, such as going from extreme happiness to sadness.  Decreased concentration.  Difficulty sleeping.  Crying spells, tearfulness.  Irritability.  Anxiety. Postpartum depression symptoms typically begin within the first month after giving birth. These symptoms include:  Difficulty sleeping or excessive sleepiness.  Marked weight loss.  Agitation.  Feelings of worthlessness.  Lack of interest in activity or food. Postpartum psychosis is a very concerning condition and can be dangerous. Fortunately, it is rare. Displaying any of the following symptoms is cause for immediate medical attention. Postpartum psychosis symptoms include:  Hallucinations and delusions.  Bizarre or disorganized behavior.  Confusion or disorientation. DIAGNOSIS  A diagnosis is made by an evaluation of your symptoms. There are no medical or lab tests that lead to a diagnosis, but there are various questionnaires that a caregiver may use to identify those with the baby blues, postpartum depression, or psychosis. Often times, a screening tool called the [place] Postnatal Depression Scale is used to diagnose depression in the postpartum period.  TREATMENT  The baby blues usually goes away on its own in 1 to 2 weeks. Social support is  often all that is needed. You should be encouraged to get adequate sleep and rest. Occasionally, you may be given medicines to help you sleep.  Postpartum depression requires treatment as it can last several months or longer if it is not treated. Treatment may include individual or group therapy, medicine, or both to address any social, physiological, and psychological factors that may play a role in the depression. Regular exercise, a healthy diet, rest, and social support may also be strongly recommended.  Postpartum psychosis is more serious and needs treatment right away. Hospitalization is often needed.  HOME CARE INSTRUCTIONS  Get as much rest as you can. Nap when the baby sleeps.  Exercise regularly. Some women find yoga and walking to be beneficial.  Eat a balanced and nourishing diet.  Do little things that you enjoy. Have a cup of tea, take a bubble bath, read your favorite magazine, or listen to your  favorite music.  Avoid alcohol.  Ask for help with household chores, cooking, grocery shopping, or running errands as needed. Do not try to do everything.  Talk to people close to you about how you are feeling. Get support from your partner, family members, friends, or other new moms.  Try to stay positive in how you think. Think about the things you are grateful for.  Do not spend a lot of time alone.  Only take medicine as directed by your caregiver.  Keep all your postpartum appointments.  Let your caregiver know if you have any concerns. SEEK MEDICAL CARE IF:  You are having a reaction or problems with your medicine.  SEEK IMMEDIATE MEDICAL CARE IF:  You have suicidal feelings.  You feel you may harm the baby or someone else. Document Released: 10/23/2003 Document Revised: 04/12/2011 Document Reviewed: 11/24/2010  [facility] Patient Information 2014 [place].  Discharge to: home   Sevag Shearn M  03/30/2013  Cosigned by: Esmeralda ArthurSandra A Rivard, MD [04/01/2013 10:24 PM]

## 2013-12-03 ENCOUNTER — Encounter (HOSPITAL_COMMUNITY): Payer: Self-pay | Admitting: Obstetrics and Gynecology

## 2014-09-18 ENCOUNTER — Encounter (HOSPITAL_COMMUNITY): Payer: Self-pay | Admitting: *Deleted

## 2014-09-18 ENCOUNTER — Emergency Department (HOSPITAL_COMMUNITY)
Admission: EM | Admit: 2014-09-18 | Discharge: 2014-09-18 | Disposition: A | Discharge status: Home / Self Care | Payer: Medicaid Other | Attending: Emergency Medicine | Admitting: Emergency Medicine

## 2014-09-18 DIAGNOSIS — Y998 Other external cause status: Secondary | ICD-10-CM | POA: Insufficient documentation

## 2014-09-18 DIAGNOSIS — Z8742 Personal history of other diseases of the female genital tract: Secondary | ICD-10-CM | POA: Insufficient documentation

## 2014-09-18 DIAGNOSIS — T189XXA Foreign body of alimentary tract, part unspecified, initial encounter: Secondary | ICD-10-CM

## 2014-09-18 DIAGNOSIS — Z8632 Personal history of gestational diabetes: Secondary | ICD-10-CM | POA: Diagnosis not present

## 2014-09-18 DIAGNOSIS — Z872 Personal history of diseases of the skin and subcutaneous tissue: Secondary | ICD-10-CM | POA: Diagnosis not present

## 2014-09-18 DIAGNOSIS — T63461A Toxic effect of venom of wasps, accidental (unintentional), initial encounter: Secondary | ICD-10-CM | POA: Diagnosis not present

## 2014-09-18 DIAGNOSIS — Z79818 Long term (current) use of other agents affecting estrogen receptors and estrogen levels: Secondary | ICD-10-CM | POA: Insufficient documentation

## 2014-09-18 DIAGNOSIS — Z87891 Personal history of nicotine dependence: Secondary | ICD-10-CM | POA: Insufficient documentation

## 2014-09-18 DIAGNOSIS — J45909 Unspecified asthma, uncomplicated: Secondary | ICD-10-CM | POA: Diagnosis not present

## 2014-09-18 DIAGNOSIS — F319 Bipolar disorder, unspecified: Secondary | ICD-10-CM | POA: Insufficient documentation

## 2014-09-18 DIAGNOSIS — Q600 Renal agenesis, unilateral: Secondary | ICD-10-CM | POA: Diagnosis not present

## 2014-09-18 DIAGNOSIS — Y9389 Activity, other specified: Secondary | ICD-10-CM | POA: Insufficient documentation

## 2014-09-18 DIAGNOSIS — Y9289 Other specified places as the place of occurrence of the external cause: Secondary | ICD-10-CM | POA: Insufficient documentation

## 2014-09-18 DIAGNOSIS — Z79899 Other long term (current) drug therapy: Secondary | ICD-10-CM | POA: Diagnosis not present

## 2014-09-18 DIAGNOSIS — T63441A Toxic effect of venom of bees, accidental (unintentional), initial encounter: Secondary | ICD-10-CM

## 2014-09-18 DIAGNOSIS — X58XXXA Exposure to other specified factors, initial encounter: Secondary | ICD-10-CM | POA: Insufficient documentation

## 2014-09-18 MED ORDER — METHYLPREDNISOLONE SODIUM SUCC 125 MG IJ SOLR
125.0000 mg | Freq: Once | INTRAMUSCULAR | Status: AC
  Administered 2014-09-18: 125 mg via INTRAVENOUS
  Filled 2014-09-18: qty 2

## 2014-09-18 MED ORDER — SODIUM CHLORIDE 0.9 % IV BOLUS (SEPSIS)
500.0000 mL | Freq: Once | INTRAVENOUS | Status: AC
  Administered 2014-09-18: 500 mL via INTRAVENOUS

## 2014-09-18 MED ORDER — FAMOTIDINE IN NACL 20-0.9 MG/50ML-% IV SOLN
20.0000 mg | Freq: Once | INTRAVENOUS | Status: AC
  Administered 2014-09-18: 20 mg via INTRAVENOUS
  Filled 2014-09-18: qty 50

## 2014-09-18 MED ORDER — GI COCKTAIL ~~LOC~~
30.0000 mL | Freq: Once | ORAL | Status: AC
  Administered 2014-09-18: 30 mL via ORAL
  Filled 2014-09-18: qty 30

## 2014-09-18 MED ORDER — ONDANSETRON HCL 4 MG/2ML IJ SOLN
4.0000 mg | Freq: Once | INTRAMUSCULAR | Status: AC
  Administered 2014-09-18: 4 mg via INTRAVENOUS
  Filled 2014-09-18: qty 2

## 2014-09-18 MED ORDER — DIPHENHYDRAMINE HCL 25 MG PO CAPS
25.0000 mg | ORAL_CAPSULE | Freq: Once | ORAL | Status: AC
  Administered 2014-09-18: 25 mg via ORAL
  Filled 2014-09-18: qty 1

## 2014-09-18 NOTE — Discharge Instructions (Signed)
You may take Benadryl every 6-8 hours as needed.  Swallowed Foreign Body, Adult You have swallowed an object (foreign body). Once the foreign body has passed through the food tube (esophagus), which leads from the mouth to the stomach, it will usually continue through the body without problems. This is because the point where the esophagus enters into the stomach is the narrowest place through which the foreign body must pass. Sometimes the foreign body gets stuck. The most common type of foreign body obstruction in adults is food impaction. Many times, bones from fish or meat products may become lodged in the esophagus or injure the throat on the way down. When there is an object that obstructs the esophagus, the most obvious symptoms are pain and the inability to swallow normally. In some cases, foreign bodies that can be life threatening are swallowed. Examples of these are certain medications and illicit drugs. Often in these instances, patients are afraid of telling what they swallowed. However, it is extremely important to tell the emergency caregiver what was swallowed because life-saving treatment may be needed.  X-ray exams may be taken to find the location of the foreign body. However, some objects do not show up well or may be too small to be seen on an X-ray image. If the foreign body is too large or too sharp, it may be too dangerous to allow it to pass on its own. You may need to see a caregiver who specializes in the digestive system (gastroenterologist). In a few cases, a specialist may need to remove the object using a method called "endoscopy". This involves passing a thin, soft, flexible tube into the food pipe to locate and remove the object. Follow up with your primary doctor or the referral you were given by the emergency caregiver. HOME CARE INSTRUCTIONS   If your caregiver says it is safe for you to eat, then only have liquids and soft foods until your symptoms improve.  Once you  are eating normally:  Cut food into small pieces.  Remove small bones from food.  Remove large seeds and pits from fruit.  Chew your food well.  Do not talk, laugh, or engage in physical activity while eating or swallowing. SEEK MEDICAL CARE IF:  You develop worsening shortness of breath, uncontrollable coughing, chest pains or high fever, greater than 102 F (38.9 C).  You are unable to eat or drink or you feel that food is getting stuck in your throat.  You have choking symptoms or cannot stop drooling.  You develop abdominal pain, vomiting (especially of blood), or rectal bleeding. MAKE SURE YOU:   Understand these instructions.  Will watch your condition.  Will get help right away if you are not doing well or get worse. Document Released: 07/08/2009 Document Revised: 04/12/2011 Document Reviewed: 07/08/2009 [facility] Patient Information 2015 [place], [place]. This information is not intended to replace advice given to you by your health care provider. Make sure you discuss any questions you have with your health care provider. Bee, Wasp, or Hornet Sting Your caregiver has diagnosed you as having an insect sting. An insect sting appears as a red lump in the skin that sometimes has a tiny hole in the center, or it may have a stinger in the center of the wound. The most common stings are from wasps, hornets and bees. Individuals have different reactions to insect stings.  A normal reaction may cause pain, swelling, and redness around the sting site.  A localized allergic reaction  may cause swelling and redness that extends beyond the sting site.  A large local reaction may continue to develop over the next 12 to 36 hours.  On occasion, the reactions can be severe (anaphylactic reaction). An anaphylactic reaction may cause wheezing; difficulty breathing; chest pain; fainting; raised, itchy, red patches on the skin; a sick feeling to your stomach (nausea); vomiting; cramping; or  diarrhea. If you have had an anaphylactic reaction to an insect sting in the past, you are more likely to have one again. HOME CARE INSTRUCTIONS   With bee stings, a small sac of poison is left in the wound. Brushing across this with something such as a credit card, or anything similar, will help remove this and decrease the amount of the reaction. This same procedure will not help a wasp sting as they do not leave behind a stinger and poison sac.  Apply a cold compress for 10 to 20 minutes every hour for 1 to 2 days, depending on severity, to reduce swelling and itching.  To lessen pain, a paste made of water and baking soda may be rubbed on the bite or sting and left on for 5 minutes.  To relieve itching and swelling, you may use take medication or apply medicated creams or lotions as directed.  Only take over-the-counter or prescription medicines for pain, discomfort, or fever as directed by your caregiver.  Wash the sting site daily with soap and water. Apply antibiotic ointment on the sting site as directed.  If you suffered a severe reaction:  If you did not require hospitalization, an adult will need to stay with you for 24 hours in case the symptoms return.  You may need to wear a medical bracelet or necklace stating the allergy.  You and your family need to learn when and how to use an anaphylaxis kit or epinephrine injection.  If you have had a severe reaction before, always carry your anaphylaxis kit with you. SEEK MEDICAL CARE IF:   None of the above helps within 2 to 3 days.  The area becomes red, warm, tender, and swollen beyond the area of the bite or sting.  You have an oral temperature above 102 F (38.9 C). SEEK IMMEDIATE MEDICAL CARE IF:  You have symptoms of an allergic reaction which are:  Wheezing.  Difficulty breathing.  Chest pain.  Lightheadedness or fainting.  Itchy, raised, red patches on the skin.  Nausea, vomiting, cramping or diarrhea. ANY  OF THESE SYMPTOMS MAY REPRESENT A SERIOUS PROBLEM THAT IS AN EMERGENCY. Do not wait to see if the symptoms will go away. Get medical help right away. Call your local emergency services (911 in [place] DO NOT drive yourself to the hospital. MAKE SURE YOU:   Understand these instructions.  Will watch your condition.  Will get help right away if you are not doing well or get worse. Document Released: 01/18/2005 Document Revised: 04/12/2011 Document Reviewed: 07/05/2009 [facility] Patient Information 2015 [place], [place]. This information is not intended to replace advice given to you by your health care provider. Make sure you discuss any questions you have with your health care provider.

## 2014-09-18 NOTE — ED Notes (Addendum)
Pt states she swallowed a yellow jacket today at 12PM. Pt states she felt the yellow jacket sting her on the way down her throat. Pt now complains of burning sensation in her esophagus. Pt states "I feel like there is something in my throat that I can't get out". Pt states she threw up the yellow jacket after ~15 minutes. Pt states she feels very anxious at this time. Pt states she has not had an allergic reaction to yellow jacket stings in the past. Pt denies difficulty breathing.

## 2014-09-18 NOTE — ED Provider Notes (Signed)
CSN: [id number]     Arrival date & time 09/18/14  1221 History  This chart was scribed for non-physician practitioner Celene Skeen, PA-C working with Arby Barrette, MD by Murriel Hopper, ED Scribe. This patient was seen in room WTR6/WTR6 and the patient's care was started at 1:19 PM.    Chief Complaint  Patient presents with  . Swallowed Yellow Jacket    . Chest Pain     Patient is a 24 y.o. female presenting with chest pain. The history is provided by the patient. No language interpreter was used.  Chest Pain Pain location:  Substernal area Pain quality: burning   Pain radiates to:  Does not radiate Pain radiates to the back: no   Pain severity:  Moderate Onset quality:  Sudden Duration:  2 hours Timing:  Constant Progression:  Partially resolved Chronicity:  New Relieved by:  None tried  HPI Comments: Emily David is a 24 y.o. female who presents to the Emergency Department complaining of swallowing an insect a few hours PTA. Pt states she swallowed a yellow jacket in her drink, and states she felt like the yellow jacket stung her while in her throat, and now complains of a burning sensation in her esophagus. Pt reports she then made herself vomit to confirm it was a yellow jacket after 15 minutes of swallowing it. Pt then called poison control and they told her to come to ED. Pt states she feels like there is something in her throat at the moment but denies breathing problems, itching or rash. Pt reports it still feels hard to swallow. Pt reports she has been stung by a bee before but states she has never had any sort of allergic reaction to it.     Past Medical History  Diagnosis Date  . Mood swings   . Boil   . Bartholin cyst   . Congenital absence of one kidney   . Asthma     winter related when sick  . Mental disorder     hx of bipolar  . Gestational diabetes mellitus, currently pregnant    Past Surgical History  Procedure Laterality Date  . Nephrectomy  1991    left    . Adenoidectomy    . Tonsillectomy    . Examination under anesthesia N/A 03/30/2013    Procedure: EXAM UNDER ANESTHESIA;  Surgeon: Purcell Nails, MD;  Location: [facility];  Service: Gynecology;  Laterality: N/A;   Family History  Problem Relation Age of Onset  . Kidney disease Maternal Grandmother   . Cancer Maternal Grandmother   . Diabetes Maternal Grandfather    Social History  Substance Use Topics  . Smoking status: Former Smoker -- 0.50 packs/day for 1 years    Types: Cigarettes    Quit date: 01/21/2013  . Smokeless tobacco: Never Used  . Alcohol Use: No     Comment: occausional   OB History    Gravida Para Term Preterm AB TAB SAB Ectopic Multiple Living   Review of Systems  HENT:       + Burning sensation in throat.  Cardiovascular: Positive for chest pain (heartburn feeling).  All other systems reviewed and are negative.   A complete 10 system review of systems was obtained and all systems are negative except as noted in the HPI and PMH.    Allergies  Review of patient's allergies indicates no known allergies.  Home Medications  Prior to Admission medications   Medication Sig Start Date End Date Taking? Authorizing Provider  albuterol (PROVENTIL HFA;VENTOLIN HFA) 108 (90 BASE) MCG/ACT inhaler Inhale 1-2 puffs into the lungs every 6 (six) hours as needed for wheezing or shortness of breath.   Yes Historical Provider, MD  amphetamine-dextroamphetamine (ADDERALL XR) 20 MG 24 hr capsule Take 20 mg by mouth daily.   Yes Historical Provider, MD  amphetamine-dextroamphetamine (ADDERALL) 20 MG tablet Take 20 mg by mouth 2 (two) times daily as needed.    Yes Historical Provider, MD  norethindrone-ethinyl estradiol (MICROGESTIN,JUNEL,LOESTRIN) 1-20 MG-MCG tablet Take 1 tablet by mouth daily.   Yes Historical Provider, MD  ranitidine (ZANTAC) 150 MG tablet Take 150 mg by mouth daily.   Yes Historical Provider, MD  venlafaxine (EFFEXOR) 37.5 MG tablet  Take 37.5 mg by mouth daily.   Yes Historical Provider, MD  amoxicillin-clavulanate (AUGMENTIN) 875-125 MG per tablet Take 1 tablet by mouth every 12 (twelve) hours. Patient not taking: Reported on 09/18/2014 03/27/13   Sanda Klein, CNM  cephALEXin (KEFLEX) 500 MG capsule Take 1 capsule (500 mg total) by mouth every 12 (twelve) hours. TO START AFTER THE CURRENT ANTIBIOTIC IS COMPLETED. Patient not taking: Reported on 09/18/2014 03/31/13   Osborn Coho, MD  ibuprofen (ADVIL,MOTRIN) 600 MG tablet Take 1 tablet (600 mg total) by mouth every 6 (six) hours. Patient not taking: Reported on 09/18/2014 03/27/13   Sanda Klein, CNM  iron polysaccharides (NIFEREX) 150 MG capsule Take 1 capsule (150 mg total) by mouth 2 (two) times daily with a meal. Patient not taking: Reported on 09/18/2014 03/27/13   Sanda Klein, CNM  oxyCODONE-acetaminophen (PERCOCET/ROXICET) 5-325 MG per tablet Take 1-2 tablets by mouth every 6 (six) hours as needed for severe pain (moderate - severe pain). Patient not taking: Reported on 09/18/2014 03/31/13   Osborn Coho, MD  prenatal vitamin w/FE, FA (NATACHEW) 29-1 MG CHEW Chew 1 tablet by mouth daily at 12 noon.    Historical Provider, MD  witch hazel-glycerin (TUCKS) pad Apply 1 application topically as needed for itching.    Historical Provider, MD   BP 133/90 mmHg  Pulse 92  Temp(Src) 98.1 F (36.7 C) (Oral)  Resp 18  SpO2 98%  LMP 09/11/2014 Physical Exam  Constitutional: She is oriented to person, place, and time. She appears well-developed and well-nourished. No distress.  HENT:  Head: Normocephalic and atraumatic.  Mouth/Throat: Oropharynx is clear and moist.  Eyes: Conjunctivae and EOM are normal. Pupils are equal, round, and reactive to light.  Neck: Normal range of motion. Neck supple. No tracheal deviation present.  Cardiovascular: Normal rate, regular rhythm and normal heart sounds.   Pulmonary/Chest: Effort normal and breath sounds normal. No stridor. No  respiratory distress.  Musculoskeletal: Normal range of motion. She exhibits no edema.  Lymphadenopathy:    She has no cervical adenopathy.  Neurological: She is alert and oriented to person, place, and time. No sensory deficit.  Skin: Skin is warm and dry. No rash noted.  Psychiatric: She has a normal mood and affect. Her behavior is normal.  Nursing note and vitals reviewed.   ED Course  Procedures (including critical care time)  DIAGNOSTIC STUDIES: Oxygen Saturation is 96% on room air, normal by my interpretation.    COORDINATION OF CARE: 1:32 PM Discussed treatment plan with pt at bedside and pt agreed to plan.   Labs Review Labs Reviewed - No data to display  Imaging Review No results found.    EKG Interpretation None  MDM   Final diagnoses:  Swallowed foreign body, initial encounter  Bee sting, accidental or unintentional, initial encounter   Nontoxic appearing, NAD. AF VSS. She was tachycardic on arrival, received Benadryl prior to my evaluation, no tachycardia on my exam.No respiratory or airway compromise. No rashes or signs/symptoms of anaphylaxis. No epi required. Given IV Solu-Medrol, Pepcid, fluids and GI cocktail with significant relief. Stable for discharge.Follow-up with PCP. Return precautions given. Patient states understanding of treatment care plan and is agreeable.  Discussed with attending Dr. Donnald Garre who agrees with plan of care.  I personally performed the services described in this documentation, which was scribed in my presence. The recorded information has been reviewed and is accurate.  Kathrynn Speed, PA-C 09/18/14 1434  Arby Barrette, MD 09/19/14 [phone]

## 2015-03-21 ENCOUNTER — Emergency Department (HOSPITAL_COMMUNITY)
Admission: EM | Admit: 2015-03-21 | Discharge: 2015-03-22 | Disposition: A | Discharge status: Home / Self Care | Payer: Medicaid Other | Attending: Physician Assistant | Admitting: Physician Assistant

## 2015-03-21 ENCOUNTER — Encounter (HOSPITAL_COMMUNITY): Payer: Self-pay | Admitting: Emergency Medicine

## 2015-03-21 ENCOUNTER — Emergency Department (HOSPITAL_COMMUNITY): Payer: Medicaid Other

## 2015-03-21 DIAGNOSIS — Z8632 Personal history of gestational diabetes: Secondary | ICD-10-CM | POA: Diagnosis not present

## 2015-03-21 DIAGNOSIS — F419 Anxiety disorder, unspecified: Secondary | ICD-10-CM | POA: Diagnosis present

## 2015-03-21 DIAGNOSIS — F1410 Cocaine abuse, uncomplicated: Secondary | ICD-10-CM | POA: Insufficient documentation

## 2015-03-21 DIAGNOSIS — Z833 Family history of diabetes mellitus: Secondary | ICD-10-CM

## 2015-03-21 DIAGNOSIS — F1990 Other psychoactive substance use, unspecified, uncomplicated: Secondary | ICD-10-CM

## 2015-03-21 DIAGNOSIS — F1110 Opioid abuse, uncomplicated: Secondary | ICD-10-CM | POA: Diagnosis not present

## 2015-03-21 DIAGNOSIS — Z87891 Personal history of nicotine dependence: Secondary | ICD-10-CM | POA: Diagnosis not present

## 2015-03-21 DIAGNOSIS — Z23 Encounter for immunization: Secondary | ICD-10-CM

## 2015-03-21 DIAGNOSIS — Z8742 Personal history of other diseases of the female genital tract: Secondary | ICD-10-CM | POA: Insufficient documentation

## 2015-03-21 DIAGNOSIS — F319 Bipolar disorder, unspecified: Secondary | ICD-10-CM | POA: Diagnosis present

## 2015-03-21 DIAGNOSIS — G4700 Insomnia, unspecified: Secondary | ICD-10-CM | POA: Diagnosis present

## 2015-03-21 DIAGNOSIS — J45909 Unspecified asthma, uncomplicated: Secondary | ICD-10-CM | POA: Insufficient documentation

## 2015-03-21 DIAGNOSIS — Z79899 Other long term (current) drug therapy: Secondary | ICD-10-CM | POA: Insufficient documentation

## 2015-03-21 DIAGNOSIS — Z872 Personal history of diseases of the skin and subcutaneous tissue: Secondary | ICD-10-CM | POA: Diagnosis not present

## 2015-03-21 DIAGNOSIS — F1220 Cannabis dependence, uncomplicated: Secondary | ICD-10-CM | POA: Diagnosis present

## 2015-03-21 DIAGNOSIS — F1210 Cannabis abuse, uncomplicated: Secondary | ICD-10-CM | POA: Insufficient documentation

## 2015-03-21 DIAGNOSIS — F1510 Other stimulant abuse, uncomplicated: Secondary | ICD-10-CM | POA: Insufficient documentation

## 2015-03-21 DIAGNOSIS — Z818 Family history of other mental and behavioral disorders: Secondary | ICD-10-CM

## 2015-03-21 DIAGNOSIS — Z3202 Encounter for pregnancy test, result negative: Secondary | ICD-10-CM | POA: Insufficient documentation

## 2015-03-21 DIAGNOSIS — F1520 Other stimulant dependence, uncomplicated: Secondary | ICD-10-CM | POA: Diagnosis present

## 2015-03-21 DIAGNOSIS — F312 Bipolar disorder, current episode manic severe with psychotic features: Principal | ICD-10-CM | POA: Diagnosis present

## 2015-03-21 DIAGNOSIS — F1124 Opioid dependence with opioid-induced mood disorder: Secondary | ICD-10-CM | POA: Diagnosis present

## 2015-03-21 LAB — COMPREHENSIVE METABOLIC PANEL
ALBUMIN: 4 g/dL (ref 3.5–5.0)
ALK PHOS: 73 U/L (ref 38–126)
ALT: 32 U/L (ref 14–54)
ANION GAP: 9 (ref 5–15)
AST: 26 U/L (ref 15–41)
BILIRUBIN TOTAL: 0.6 mg/dL (ref 0.3–1.2)
BUN: 8 mg/dL (ref 6–20)
CALCIUM: 9.1 mg/dL (ref 8.9–10.3)
CO2: 24 mmol/L (ref 22–32)
Chloride: 110 mmol/L (ref 101–111)
Creatinine, Ser: 0.85 mg/dL (ref 0.44–1.00)
GLUCOSE: 116 mg/dL — AB (ref 65–99)
Potassium: 4 mmol/L (ref 3.5–5.1)
Sodium: 143 mmol/L (ref 135–145)
TOTAL PROTEIN: 7.1 g/dL (ref 6.5–8.1)

## 2015-03-21 LAB — CBC
HEMATOCRIT: 36.3 % (ref 36.0–46.0)
Hemoglobin: 11.6 g/dL — ABNORMAL LOW (ref 12.0–15.0)
MCH: 28 pg (ref 26.0–34.0)
MCHC: 32 g/dL (ref 30.0–36.0)
MCV: 87.5 fL (ref 78.0–100.0)
PLATELETS: 263 10*3/uL (ref 150–400)
RBC: 4.15 MIL/uL (ref 3.87–5.11)
RDW: 12.9 % (ref 11.5–15.5)
WBC: 7.7 10*3/uL (ref 4.0–10.5)

## 2015-03-21 LAB — RAPID URINE DRUG SCREEN, HOSP PERFORMED
Amphetamines: POSITIVE — AB
BARBITURATES: NOT DETECTED
Benzodiazepines: NOT DETECTED
COCAINE: POSITIVE — AB
Opiates: POSITIVE — AB
Tetrahydrocannabinol: POSITIVE — AB

## 2015-03-21 LAB — ETHANOL

## 2015-03-21 LAB — SALICYLATE LEVEL: Salicylate Lvl: <4 mg/dL (ref 2.8–30.0)

## 2015-03-21 LAB — POC URINE PREG, ED: PREG TEST UR: NEGATIVE

## 2015-03-21 LAB — ACETAMINOPHEN LEVEL

## 2015-03-21 NOTE — ED Notes (Signed)
Pt sent from [facility] for medical clearance. 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 Passive SI, no plan.

## 2015-03-21 NOTE — Discharge Instructions (Signed)
°  Substance Abuse Testing °WHY AM I HAVING THIS TEST? °Substance abuse testing is done to identify the presence of drugs in the body. It may also be done to: °· Help guide treatment if you have seizures. °· Measure the levels of certain medicines in your body, including anabolic steroids, stimulants, diuretics, and lithium. °Substance abuse testing is most often used by employers or law enforcement agencies to identify whether a person has used illegal drugs, such as cocaine, amphetamines, and marijuana. °WHAT KIND OF SAMPLE IS TAKEN? °Your health care provider may collect at least one of the following to perform the test: °· Urine. A urine sample is collected in a sterile container given to you by the lab. If you are asked to provide a urine sample, do not alter it in any way. °· Hair. A sample of hair requires cutting 50 strands of hair from your scalp. °· Blood. A blood sample is usually collected by inserting a needle into a vein. °HOW DO I PREPARE FOR THE TEST? °You may be asked to provide a list of your current prescription medicines. If the test is required for employment or legal reasons, you will be asked to give consent for the test. There is no other preparation required. °HOW ARE THE TEST RESULTS REPORTED? °Your test results will be reported as either positive or negative. It may be your responsibility to obtain your test results. Ask the lab or department performing the test when and how you will get your results. °WHAT DO THE RESULTS MEAN? °A negative result means no drugs were found. °A positive result may mean you have recently used drugs. If your result is positive, more testing will be done to confirm the presence of drugs. °  °This information is not intended to replace advice given to you by your health care provider. Make sure you discuss any questions you have with your health care provider. °  °Document Released: 01/18/2005 Document Revised: 02/08/2014 Document Reviewed: 06/15/2013 °Elsevier  Interactive Patient Education ©2016 [employer]. ° ° °

## 2015-03-21 NOTE — ED Notes (Signed)
Report called to Kimberly,RN at [facility]. Pt to go to [phone].  [facility] called for transport
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
## 2015-03-21 NOTE — BHH Counselor (Signed)
Disposition: Per Hulan Fess, NP, Pt meets inpt tx criteria. Per Binnie Rail, AC, Pt accepted to [facility]-1 under Dr Dub Mikes. Pt sent to [facility] for med clearance before [facility] admission.  TTS Counselor informed [facility] charge nurse that pt is coming for medical clearance and has a bed at [facility].  - Cyndie Mull, [facility]   Therapeutic Triage   [facility]

## 2015-03-22 ENCOUNTER — Inpatient Hospital Stay (HOSPITAL_COMMUNITY)
Admission: RE | Admit: 2015-03-22 | Discharge: 2015-03-25 | DRG: 885 | Disposition: A | Discharge status: Home / Self Care | Payer: Medicaid Other | Attending: Psychiatry | Admitting: Psychiatry

## 2015-03-22 ENCOUNTER — Encounter (HOSPITAL_COMMUNITY): Payer: Self-pay

## 2015-03-22 DIAGNOSIS — F0630 Mood disorder due to known physiological condition, unspecified: Secondary | ICD-10-CM | POA: Insufficient documentation

## 2015-03-22 DIAGNOSIS — F1124 Opioid dependence with opioid-induced mood disorder: Secondary | ICD-10-CM | POA: Diagnosis present

## 2015-03-22 DIAGNOSIS — F1994 Other psychoactive substance use, unspecified with psychoactive substance-induced mood disorder: Secondary | ICD-10-CM | POA: Diagnosis present

## 2015-03-22 DIAGNOSIS — F1920 Other psychoactive substance dependence, uncomplicated: Secondary | ICD-10-CM | POA: Diagnosis present

## 2015-03-22 DIAGNOSIS — F1220 Cannabis dependence, uncomplicated: Secondary | ICD-10-CM | POA: Diagnosis present

## 2015-03-22 DIAGNOSIS — F1114 Opioid abuse with opioid-induced mood disorder: Secondary | ICD-10-CM | POA: Insufficient documentation

## 2015-03-22 DIAGNOSIS — Z833 Family history of diabetes mellitus: Secondary | ICD-10-CM | POA: Diagnosis not present

## 2015-03-22 DIAGNOSIS — F1210 Cannabis abuse, uncomplicated: Secondary | ICD-10-CM

## 2015-03-22 DIAGNOSIS — F312 Bipolar disorder, current episode manic severe with psychotic features: Secondary | ICD-10-CM | POA: Diagnosis present

## 2015-03-22 DIAGNOSIS — Z818 Family history of other mental and behavioral disorders: Secondary | ICD-10-CM | POA: Diagnosis not present

## 2015-03-22 DIAGNOSIS — F1520 Other stimulant dependence, uncomplicated: Secondary | ICD-10-CM | POA: Diagnosis present

## 2015-03-22 DIAGNOSIS — Z87891 Personal history of nicotine dependence: Secondary | ICD-10-CM | POA: Diagnosis not present

## 2015-03-22 DIAGNOSIS — G4700 Insomnia, unspecified: Secondary | ICD-10-CM | POA: Diagnosis present

## 2015-03-22 DIAGNOSIS — Z23 Encounter for immunization: Secondary | ICD-10-CM | POA: Diagnosis not present

## 2015-03-22 DIAGNOSIS — F419 Anxiety disorder, unspecified: Secondary | ICD-10-CM | POA: Diagnosis present

## 2015-03-22 DIAGNOSIS — J45909 Unspecified asthma, uncomplicated: Secondary | ICD-10-CM | POA: Diagnosis present

## 2015-03-22 MED ORDER — INFLUENZA VAC SPLIT QUAD 0.5 ML IM SUSY
0.5000 mL | PREFILLED_SYRINGE | INTRAMUSCULAR | Status: DC
  Filled 2015-03-22: qty 0.5

## 2015-03-22 MED ORDER — CLONIDINE HCL 0.1 MG PO TABS: 0.1000 mg | ORAL_TABLET | Freq: Every day | ORAL | Status: DC

## 2015-03-22 MED ORDER — ACETAMINOPHEN 325 MG PO TABS
650.0000 mg | ORAL_TABLET | Freq: Four times a day (QID) | ORAL | Status: DC | PRN
  Administered 2015-03-25: 650 mg via ORAL
  Filled 2015-03-22: qty 2

## 2015-03-22 MED ORDER — ONDANSETRON 4 MG PO TBDP: 4.0000 mg | ORAL_TABLET | Freq: Four times a day (QID) | ORAL | Status: DC | PRN

## 2015-03-22 MED ORDER — AMPHETAMINE-DEXTROAMPHETAMINE 10 MG PO TABS
20.0000 mg | ORAL_TABLET | Freq: Two times a day (BID) | ORAL | Status: DC
  Administered 2015-03-24: 20 mg via ORAL
  Filled 2015-03-22 (×2): qty 2

## 2015-03-22 MED ORDER — HYDROXYZINE HCL 25 MG PO TABS
25.0000 mg | ORAL_TABLET | Freq: Four times a day (QID) | ORAL | Status: DC | PRN
  Filled 2015-03-22: qty 1

## 2015-03-22 MED ORDER — NAPROXEN 500 MG PO TABS
500.0000 mg | ORAL_TABLET | Freq: Two times a day (BID) | ORAL | Status: DC | PRN
  Administered 2015-03-24: 500 mg via ORAL
  Filled 2015-03-22: qty 1

## 2015-03-22 MED ORDER — MAGNESIUM HYDROXIDE 400 MG/5ML PO SUSP: 30.0000 mL | Freq: Every day | ORAL | Status: DC | PRN

## 2015-03-22 MED ORDER — VENLAFAXINE HCL ER 75 MG PO CP24
75.0000 mg | ORAL_CAPSULE | Freq: Every day | ORAL | Status: DC
  Administered 2015-03-22 – 2015-03-24 (×3): 75 mg via ORAL
  Filled 2015-03-22 (×5): qty 1

## 2015-03-22 MED ORDER — CLONIDINE HCL 0.1 MG PO TABS
0.1000 mg | ORAL_TABLET | Freq: Four times a day (QID) | ORAL | Status: AC
  Administered 2015-03-22 – 2015-03-23 (×5): 0.1 mg via ORAL
  Filled 2015-03-22 (×12): qty 1

## 2015-03-22 MED ORDER — METHOCARBAMOL 500 MG PO TABS
500.0000 mg | ORAL_TABLET | Freq: Three times a day (TID) | ORAL | Status: DC | PRN
  Filled 2015-03-22: qty 1

## 2015-03-22 MED ORDER — LOPERAMIDE HCL 2 MG PO CAPS: 2.0000 mg | ORAL_CAPSULE | ORAL | Status: DC | PRN

## 2015-03-22 MED ORDER — ALUM & MAG HYDROXIDE-SIMETH 200-200-20 MG/5ML PO SUSP: 30.0000 mL | ORAL | Status: DC | PRN

## 2015-03-22 MED ORDER — CLONIDINE HCL 0.1 MG PO TABS
0.1000 mg | ORAL_TABLET | ORAL | Status: DC
  Filled 2015-03-22 (×4): qty 1

## 2015-03-22 MED ORDER — DICYCLOMINE HCL 20 MG PO TABS: 20.0000 mg | ORAL_TABLET | Freq: Four times a day (QID) | ORAL | Status: DC | PRN

## 2015-03-22 NOTE — Progress Notes (Signed)
Patient alert and oriented x 4. Patient denies pain/SI/HI/AVH. Patient states, "My problem is drugs, I have been using meth and started using heroin again. My son has been took from me by DSS, and now his daddy has him." Patient states, "I need help to get off the drugs, and get my other medication right so I can get my kid back." Patient states she has been using drugs since she was 65 but she was clean for three years. During skin assessment patient has two tattoos on her body, one on her back, the other in her right calf. Patient's bilateral forearms are bruised from IV drug use. On patient's left AC there is a reddened area. Patient was oriented to unit and room. All admit paperwork was signed with this Clinical research associate.

## 2015-03-22 NOTE — Progress Notes (Signed)
Emily David has been in and out of her bed most of the day. She completed her daily assessment first thing this morning and on it she wrote she denied SI today and she rated her depression, hopelessness and anxiety " 7/6/6/", respectively. She takes her scheduled meds. A She refused her am adderrall and said she didn't want to take it anymore. R Safety is in place and poc cont.
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
## 2015-03-22 NOTE — BHH Group Notes (Signed)
[facility] Group Notes:  (Nursing/MHT/Case Management/Adjunct)  Date:  03/22/2015  Time:  1315  Type of Therapy:  Nurse Education  /  Life Skills The group focuses on teaching patients how to identify their needs as well as identify healthy coping skills used to get their needs met.  Participation Level:  Did Not Attend  Participation Quality:     Affect:    Cognitive:    Insight:    Engagement in Group:    Modes of Intervention:    Summary of Progress/Problems:  Lauralyn Primes 03/22/2015, 5:59 PM
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
## 2015-03-22 NOTE — Tx Team (Signed)
Initial Interdisciplinary Treatment Plan   PATIENT STRESSORS: Legal issue Loss of custody of son. Marital or family conflict Medication change or noncompliance Substance abuse   PATIENT STRENGTHS: Ability for insight Average or above average intelligence Capable of independent living Motivation for treatment/growth Physical Health   PROBLEM LIST: Problem List/Patient Goals Date to be addressed Date deferred Reason deferred Estimated date of resolution  Substance abuse 03/22/2015     "to get on the right medications" 03/22/2015                                                DISCHARGE CRITERIA:  Reduction of life-threatening or endangering symptoms to within safe limits Verbal commitment to aftercare and medication compliance Withdrawal symptoms are absent or subacute and managed without 24-hour nursing intervention  PRELIMINARY DISCHARGE PLAN: Attend 12-step recovery group Outpatient therapy  PATIENT/FAMIILY INVOLVEMENT: This treatment plan has been presented to and reviewed with the patient, Emily David.  The patient and family have been given the opportunity to ask questions and make suggestions.  Emily David 03/22/2015, 2:04 AM

## 2015-03-22 NOTE — Progress Notes (Signed)
The patient attended this evening's A. A. Meeting and was appropriate.  
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
## 2015-03-22 NOTE — B[facility] Group Notes (Signed)
[facility] Group Notes:  (Clinical Social Work)   11/30/2014     10:00-11:00AM  Summary of Progress/Problems:   In today's process group patients listed needs that human beings have, then listed healthy and unhealthy coping techniques, determining that unhealthy coping techniques work initially, but eventually become harmful.  There was then a discussion of various ideas of healthy coping techniques and how to go about switching from unhealthy to healthy choices.  Motivational Interviewing and the whiteboard were utilized for the exercises.  The patient expressed that the unhealthy coping she often uses is pushing people away and using drugs off and on for the last 10 years.  She stated she has been doing this to self-medicate from a life full of loss.  It was overwhelming for her to think about being kind to herself and speaking less negatively to herse.f  Type of Therapy:  Group Therapy - Process   Participation Level:  Active  Participation Quality:  Attentive and Sharing  Affect:  Blunted and Depressed  Cognitive:  Appropriate and Oriented  Insight:  Engaged  Engagement in Therapy:  Engaged  Modes of Intervention:  Education, Motivational Interviewing  Ambrose Mantle, LCSW 03/22/2015, 12:17 PM

## 2015-03-23 MED ORDER — LAMOTRIGINE 25 MG PO TABS
25.0000 mg | ORAL_TABLET | Freq: Every day | ORAL | Status: DC
  Administered 2015-03-23 – 2015-03-24 (×2): 25 mg via ORAL
  Filled 2015-03-23 (×3): qty 1

## 2015-03-23 MED ORDER — LAMOTRIGINE 25 MG PO TABS
ORAL_TABLET | ORAL | Status: AC
  Filled 2015-03-23: qty 1

## 2015-03-23 NOTE — Progress Notes (Signed)
D: Patient alert and oriented x 4. Patient denies pain/SI/HI/AVH and withdraw symptoms. Patient states, "Today was a better day than yesterday. I am feeling a lot better."  A: Staff to monitor Q 15 mins for safety. Encouragement and support offered. Scheduled medications administered per orders. R: Patient remains safe on the unit. Patient attended group tonight. Patient visible on the unit and interacting with peers. Patient taking administered medications.

## 2015-03-23 NOTE — [facility] Group Notes (Signed)
[facility] Group Notes:  (Clinical Social Work)  03/23/2015  10:00-11:00AM  Summary of Progress/Problems:   The main focus of today's process group was to   1)  discuss the importance of adding supports  2)  define health supports versus unhealthy supports  3)  identify the patient's current unhealthy supports and plan how to handle them  4)  Identify the patient's current healthy supports and plan what to add.  An emphasis was placed on using counselor, doctor, therapy groups, 12-step groups, and problem-specific support groups to expand supports.    The patient expressed full comprehension of the concepts presented, and agreed that there is a need to add more supports.  The patient stated she has no current healthy supports, and expressed several times that she does not even know what that is, or how to find healthy supports.  Type of Therapy:  Process Group with Motivational Interviewing  Participation Level:  Active  Participation Quality:  Attentive and Sharing  Affect:  Anxious  Cognitive:  Appropriate  Insight:  Developing/Improving  Engagement in Therapy:  Engaged  Modes of Intervention:   Education, Support and Processing, Activity  Ambrose Mantle, LCSW 03/23/2015

## 2015-03-23 NOTE — Progress Notes (Signed)
[name] is seen out in the milieu...tolerated well. AShe completed her daily assessment and on it she wrote she deneid SI and she rated her depression, hopelessness and anxiety " 4/4/4", respectively. A She is started on lamictal per MD order. R Safety in place.

## 2015-03-23 NOTE — Progress Notes (Signed)
Patient did attend the evening speaker AA meeting.  
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
## 2015-03-23 NOTE — Progress Notes (Signed)
Nutrition Brief Note  Patient identified on the Malnutrition Screening Tool (MST) Report  Pt with some weight loss x 2 years. Weight from 2015 was during pt's pregnancy.   Wt Readings from Last 15 Encounters:  03/22/15 228 lb (103.42 kg)  03/26/13 276 lb 5 oz (125.335 kg)  02/16/13 263 lb 6.4 oz (119.477 kg)  01/23/13 255 lb 3.2 oz (115.758 kg)  08/28/12 242 lb 4.8 oz (109.907 kg)  08/07/12 237 lb (107.502 kg)  07/05/12 238 lb 3.2 oz (108.047 kg)  06/07/12 235 lb 12.8 oz (106.958 kg)  09/05/11 240 lb (108.863 kg)  01/09/11 230 lb (104.327 kg)    Body mass index is 36.82 kg/(m^2). Patient meets criteria for obesity based on current BMI.   Diet Order: Diet regular Room service appropriate?: Yes; Fluid consistency:: Thin Pt is also offered choice of unit snacks mid-morning and mid-afternoon.  Pt is eating as desired.   Labs and medications reviewed.   No nutrition interventions warranted at this time. If nutrition issues arise, please consult RD.   Tilda Franco, MS, RD, LDN Pager: [phone] After Hours Pager: [phone]

## 2015-03-23 NOTE — [facility] Group Notes (Signed)
[facility] Group Notes:  (Nursing/MHT/Case Management/Adjunct)  Date:  03/23/2015  Time:  1315  Type of Therapy:  Nurse Education  /  Life Skills : the group focuses on teaching patients how to identify their needs as well as identify the skills needsed to get their needs met.  Participation Level:  Pt did not attend.  Participation Quality: na/   Affect:  n/a  Cognitive:  n/a  Insight:n/a    Engagement in Group:n/a   Modes of Intervention: n/a   Summary of Progress/Problems:  Lauralyn Primes 03/23/2015, 5:10 PM

## 2015-03-24 ENCOUNTER — Encounter (HOSPITAL_COMMUNITY): Payer: Self-pay | Admitting: Psychiatry

## 2015-03-24 DIAGNOSIS — F1920 Other psychoactive substance dependence, uncomplicated: Secondary | ICD-10-CM | POA: Diagnosis present

## 2015-03-24 DIAGNOSIS — F1994 Other psychoactive substance use, unspecified with psychoactive substance-induced mood disorder: Secondary | ICD-10-CM | POA: Diagnosis present

## 2015-03-24 NOTE — [facility] Group Notes (Signed)
[facility] LCSW Group Therapy  03/24/2015 1:04 PM  Type of Therapy:  Group Therapy  Participation Level:  Active  Participation Quality:  Attentive  Affect:  Appropriate  Cognitive:  Alert and Oriented  Insight:  Engaged  Engagement in Therapy:  Engaged  Modes of Intervention:  Confrontation, Discussion, Education, Exploration, Problem-solving, Rapport Building, Socialization and Support  Summary of Progress/Problems: Today's Topic: Overcoming Obstacles. Patients identified one short term goal and potential obstacles in reaching this goal. Patients processed barriers involved in overcoming these obstacles. Patients identified steps necessary for overcoming these obstacles and explored motivation (internal and external) for facing these difficulties head on. Emily David was attentive and engaged during group. She shared that a big obstacle for her is "not getting frustrated and leaving the hospital before I'm ready." Emily David changed her mind about d/cing today and stated that she needs to get into [facility].   Smart, [name] LCSW 03/24/2015, 1:04 PM

## 2015-03-24 NOTE — Plan of Care (Signed)
Problem: Consults Goal: Depression Patient Education See Patient Education Module for education specifics.  Outcome: Progressing Nurse discussed depression/coping skills with patient.        
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
## 2015-03-24 NOTE — Progress Notes (Signed)
Pt did not attend evening AA group. Pt was in bed.  

## 2015-03-24 NOTE — [facility] Suicide Risk Assessment (Signed)
[facility] INPATIENT:  Family/Significant Other Suicide Prevention Education  Suicide Prevention Education:  Contact Attempts: Brylan Seubert (pt's mother) [phone] has been identified by the patient as the family member/significant other with whom the patient will be residing, and identified as the person(s) who will aid the patient in the event of a mental health crisis.  With written consent from the patient, two attempts were made to provide suicide prevention education, prior to and/or following the patient's discharge.  We were unsuccessful in providing suicide prevention education.  A suicide education pamphlet was given to the patient to share with family/significant other.  Date and time of first attempt: 8:45AM on 03/24/15 (unable to leave voicemail) Date and time of second attempt: 10:12AM on 03/24/15 (unable to leave voicemail)   Smart, Jenine Krisher LCSW 03/24/2015, 10:12 AM

## 2015-03-24 NOTE — Progress Notes (Signed)
D:  Patient's self inventory sheet, patient sleeps good, no sleep medication given.  Good appetite, high energy level, good concentration.  Denied depression, anxiety and hopeless.  Denied withdrawals.  Denied SI.  Denied physical problems.  Denied pain.  Goal is to be discharged.  Plans to talk to MD.  "Thank you for all your great help!" A:  Medications administered per MD orders.  Emotional support and encouragement given patient. R:  Denied SI and HI, contracts for safety.  Denied A/V hallucinations.  Safety maintained with 15 minute checks.

## 2015-03-24 NOTE — B[facility] Group Notes (Signed)
[facility] LCSW Aftercare Discharge Planning Group Note   03/24/2015 10:59 AM  Participation Quality:  Appropriate   Mood/Affect:  Appropriate  Depression Rating:  0  Anxiety Rating:  0  Thoughts of Suicide:  No Will you contract for safety?   NA  Current AVH:  No  Plan for Discharge/Comments:  During group, pt asked to d/c today and follow-up at [facility]. After talking with MD, pt decided to pursue inpatient treatment-possibly [facility]. Referral faxed.   Transportation Means: car in parking lot   Supports: mother  Smart, [place] LCSW

## 2015-03-24 NOTE — Progress Notes (Signed)
UA completed and put in refrigerator to be picked up by lab and taken to hospital for testing.

## 2015-03-24 NOTE — Progress Notes (Addendum)
  [facility] Adult Case Management Discharge Plan : 03/24/2015 11:01 AM --Discharge cancelled per Dr. Dub Mikes. Pt decided to pursue inpatient treatment. Please disregard this discharge note.   Will you be returning to the same living situation after discharge:  Yes,  home At discharge, do you have transportation home?: Yes,  car in parking lot Do you have the ability to pay for your medications: Yes,  [facility] Medicaid  Release of information consent forms completed and submitted to medical records by CSW.  Patient to Follow up at: Follow-up Information    Follow up with [facility]  On 04/02/2015.   Why:  Appt on this date at 11:30AM with Ellis Savage. At this appt. Misty Stanley will refer you for counseling (please make sure to tell her that you would like counseling referral).    Contact information:   [address] [place], [place] [address] Phone: [phone] Fax: [phone]       Next level of care provider has access to [facility] Link:no  Safety Planning and Suicide Prevention discussed: Yes,  Contact attempts made with pt's mother. SPE completed with pt and she was provided with Mobile Crisis information.  Have you used any form of tobacco in the last 30 days? (Cigarettes, Smokeless Tobacco, Cigars, and/or Pipes): Yes  Has patient been referred to the Quitline?: Patient refused referral  Patient has been referred for addiction treatment: Yes-[facility] for o/p treatment  Smart, Emily Pursifull LCSW 03/24/2015, 10:13 AM

## 2015-03-25 LAB — GC/CHLAMYDIA PROBE AMP (~~LOC~~) NOT AT ARMC
Chlamydia: NEGATIVE
Neisseria Gonorrhea: NEGATIVE

## 2015-03-25 NOTE — Progress Notes (Signed)
D     Pt was in bed asleep most of the shift   She did wake up and request something for pain but refused her detox medications and requested no sleep medication   Pt was pleasant during the brief interaction A   Verbal support given   Medications administered and effectiveness monitored    Q 15 min checks   Discussed withdrawal symptoms with patient R   Pt safe at present  Pt denies withdrawal symptoms

## 2015-03-25 NOTE — [facility] Suicide Risk Assessment (Signed)
[facility] Discharge Suicide Risk Assessment   Principal Problem: Polysubstance (including opioids) dependence w/o physiol dependence [facility]) Discharge Diagnoses:  Patient Active Problem List   Diagnosis Date Noted  . Polysubstance (including opioids) dependence w/o physiol dependence (HCC) [F19.20] 03/24/2015  . Substance induced mood disorder (HCC) [F19.94] 03/24/2015  . Severe manic bipolar 1 disorder with psychotic behavior (HCC) [F31.2] 03/22/2015  . Opioid abuse with opioid-induced mood disorder (HCC) [F11.14] 03/22/2015  . Cannabis abuse [F12.10] 03/22/2015  . Mood disorder in conditions classified elsewhere [F06.30]   . Leukocytosis, unspecified [D72.829] 03/24/2013  . Laceration of vaginal wall or sulcus without perineal laceration during delivery [O71.4] 03/24/2013  . NSVD (normal spontaneous vaginal delivery) [O80] 03/23/2013  . Gestational diabetes [O24.419] 03/21/2013  . First trimester pregnancy [Z33.1] 08/28/2012  . Tinea corporis [B35.4] 08/28/2012  . Bipolar 1 disorder (HCC) [F31.9] 08/28/2012    Total Time spent with patient: 20 minutes  Musculoskeletal: Strength & Muscle Tone: within normal limits Gait & Station: normal Patient leans: normal   Psychiatric Specialty Exam: Review of Systems  Constitutional: Negative.   HENT: Negative.   Eyes: Negative.   Respiratory: Negative.   Cardiovascular: Negative.   Gastrointestinal: Negative.   Genitourinary: Negative.   Musculoskeletal: Negative.   Skin: Negative.   Endo/Heme/Allergies: Negative.   Psychiatric/Behavioral: Positive for substance abuse.    Blood pressure 107/82, pulse 86, temperature 98.3 F (36.8 C), temperature source Oral, resp. rate 20, height  (1.676 m), weight 103.42 kg (228 lb), last menstrual period 03/11/2015, SpO2 100 %, unknown if currently breastfeeding.Body mass index is 36.82 kg/(m^2).  General Appearance: Fairly Groomed  Patent attorney::  Fair  Speech:  Clear and Coherent409  Volume:   Normal  Mood:  Euthymic  Affect:  Appropriate  Thought Process:  Coherent and Goal Directed  Orientation:  Full (Time, Place, and Person)  Thought Content:  plans as she moves on, relapse prevention plan  Suicidal Thoughts:  No  Homicidal Thoughts:  No  Memory:  Immediate;   Fair Recent;   Fair Remote;   Fair  Judgement:  Fair  Insight:  Present  Psychomotor Activity:  Normal  Concentration:  Fair  Recall:  [employer] of Knowledge:Fair  Language: Fair  Akathisia:  No  Handed:  Right  AIMS (if indicated):     Assets:  Desire for Improvement  Sleep:  Number of Hours: 5.5  Cognition: WNL  ADL's:  Intact  In full contact with reality. There are no active S/S of withdrawal. There are no active SI plans or intent. She will have an appointment tomorrow AM to be admitted into [facility]. She states she is committed to make this work. States she is ready to do the work Mental Status Per Nursing Assessment::   On Admission:  NA  Demographic Factors:  Adolescent or young adult and Caucasian  Loss Factors: Legal issues  Historical Factors: none identified  Risk Reduction Factors:   Responsible for children under 53 years of age and Sense of responsibility to family  Continued Clinical Symptoms:  Alcohol/Substance Abuse/Dependencies  Cognitive Features That Contribute To Risk:  None    Suicide Risk:  Minimal: No identifiable suicidal ideation.  Patients presenting with no risk factors but with morbid ruminations; may be classified as minimal risk based on the severity of the depressive symptoms  Follow-up Information    Follow up with [facility]  On 04/02/2015.   Why:  Appt on this date at 11:30AM with Ellis Savage. At this  appt. Misty Stanley will refer you for counseling (please make sure to tell her that you would like counseling referral). Please reschedule appt if accepted to [facility] for 28 day program.     Contact information:   [address] [place], [place] [address] Phone: [phone] Fax: [phone]       Follow up with [facility] On 03/26/2015.   Why:  Appt on this date at 8:00AM for screening/admission (scheduled by Tyrell Antonio). Please bring Medicaid card/photo ID. Thank you.    Contact information:   [address] [place], [place] [address] Phone: [phone] Fax: [phone]      Plan Of Care/Follow-up recommendations:  Activity:  as tolerated Diet:  regular Follow up [facility] as above Avaleigh Decuir A, MD 03/25/2015, 12:55 PM

## 2015-03-25 NOTE — Progress Notes (Signed)
  [facility] Adult Case Management Discharge Plan :  Will you be returning to the same living situation after discharge:  No. Pt accepted for screening and possible admission to [facility] for: 03/26/15 At discharge, do you have transportation home?: Yes,  car in parking lot Do you have the ability to pay for your medications: Yes,  [facility] Medicaid  Release of information consent forms completed and submitted to medical records by CSW.  Patient to Follow up at: Follow-up Information    Follow up with [facility]  On 04/02/2015.   Why:  Appt on this date at 11:30AM with Ellis Savage. At this appt. Misty Stanley will refer you for counseling (please make sure to tell her that you would like counseling referral).    Contact information:   [address] [place], [place] [address] Phone: [phone] Fax: [phone]       Follow up with [facility] On 03/26/2015.   Why:  Appt on this date at 8:00AM for screening/admission (scheduled by Tyrell Antonio). Please bring Medicaid card/photo ID. Thank you.    Contact information:   [address] [place], [place] [address] Phone: [phone] Fax: [phone]      Next level of care provider has access to [facility] Link:no  Safety Planning and Suicide Prevention discussed: Yes,  Contact attempts made with pt's mother. SPI pamphlet and mobile crisis information provided to pt and she was encouraged to share information with her support network.   Have you used any form of tobacco in the last 30 days? (Cigarettes, Smokeless Tobacco, Cigars, and/or Pipes): Yes  Has patient been referred to the Quitline?: Patient refused referral  Patient has been referred for addiction treatment: Yes-see above.   Smart, Mariya Mottley LCSW 03/25/2015, 10:08 AM
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
## 2015-03-25 NOTE — Progress Notes (Signed)
Discharge note:  Patient received all personal belongings from unit and locker.  Reviewed AVS/discharge instructions/follow up appointments/prescriptions/medications with patient.  She indicated understanding.  She left ambulatory for her car, as she had her own transportation here.  She denies SI/HI/AVH.  She left in good spirits.
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
## 2015-03-26 LAB — RPR: RPR Ser Ql: NONREACTIVE

## 2015-03-27 LAB — HEPATITIS PANEL, ACUTE
HEP B S AG: NEGATIVE
Hep A IgM: NEGATIVE
Hep B C IgM: NEGATIVE

## 2015-03-27 LAB — HIV ANTIBODY (ROUTINE TESTING W REFLEX): HIV SCREEN 4TH GENERATION: NONREACTIVE

## 2015-03-27 LAB — RPR: RPR Ser Ql: NONREACTIVE

## 2015-04-27 ENCOUNTER — Emergency Department (HOSPITAL_COMMUNITY)
Admission: EM | Admit: 2015-04-27 | Discharge: 2015-04-27 | Disposition: A | Discharge status: Home / Self Care | Payer: Medicaid Other

## 2020-07-31 ENCOUNTER — Other Ambulatory Visit: Payer: Self-pay

## 2020-07-31 ENCOUNTER — Emergency Department (HOSPITAL_BASED_OUTPATIENT_CLINIC_OR_DEPARTMENT_OTHER)
Admission: EM | Admit: 2020-07-31 | Discharge: 2020-07-31 | Disposition: A | Discharge status: Home / Self Care | Payer: Self-pay | Attending: Emergency Medicine | Admitting: Emergency Medicine

## 2020-07-31 ENCOUNTER — Encounter (HOSPITAL_BASED_OUTPATIENT_CLINIC_OR_DEPARTMENT_OTHER): Payer: Self-pay

## 2020-07-31 DIAGNOSIS — Z87891 Personal history of nicotine dependence: Secondary | ICD-10-CM | POA: Insufficient documentation

## 2020-07-31 DIAGNOSIS — J45909 Unspecified asthma, uncomplicated: Secondary | ICD-10-CM | POA: Insufficient documentation

## 2020-07-31 DIAGNOSIS — A599 Trichomoniasis, unspecified: Secondary | ICD-10-CM | POA: Insufficient documentation

## 2020-07-31 DIAGNOSIS — R229 Localized swelling, mass and lump, unspecified: Secondary | ICD-10-CM

## 2020-07-31 DIAGNOSIS — R5383 Other fatigue: Secondary | ICD-10-CM | POA: Insufficient documentation

## 2020-07-31 LAB — WET PREP, GENITAL
Clue Cells Wet Prep HPF POC: NONE SEEN
Sperm: NONE SEEN
Yeast Wet Prep HPF POC: NONE SEEN

## 2020-07-31 LAB — PREGNANCY, URINE: Preg Test, Ur: NEGATIVE

## 2020-07-31 MED ORDER — CEFTRIAXONE SODIUM 500 MG IJ SOLR
500.0000 mg | Freq: Once | INTRAMUSCULAR | Status: AC
  Administered 2020-07-31: 500 mg via INTRAMUSCULAR
  Filled 2020-07-31: qty 500

## 2020-07-31 MED ORDER — IBUPROFEN 400 MG PO TABS
600.0000 mg | ORAL_TABLET | Freq: Once | ORAL | Status: AC
  Administered 2020-07-31: 600 mg via ORAL
  Filled 2020-07-31: qty 1

## 2020-07-31 MED ORDER — DOXYCYCLINE HYCLATE 100 MG PO CAPS: 100.0000 mg | ORAL_CAPSULE | Freq: Two times a day (BID) | ORAL | 0 refills | Status: AC

## 2020-07-31 MED ORDER — METRONIDAZOLE 500 MG PO TABS: 500.0000 mg | ORAL_TABLET | Freq: Two times a day (BID) | ORAL | 0 refills | Status: AC

## 2020-07-31 MED ORDER — LIDOCAINE HCL (PF) 1 % IJ SOLN
1.0000 mL | Freq: Once | INTRAMUSCULAR | Status: AC
  Administered 2020-07-31: 1 mL
  Filled 2020-07-31: qty 5

## 2020-07-31 NOTE — ED Triage Notes (Signed)
Pt c/o abscess x 5/scattered x 1 week-NAD-steady gait

## 2020-07-31 NOTE — ED Provider Notes (Signed)
[facility] EMERGENCY DEPARTMENT Provider Note   CSN: [id number] Arrival date & time: 07/31/20  1927     History Chief Complaint  Patient presents with   Abscess    Emily David is a 30 y.o. female.  HPI     Christeena David is a 30 y.o. female, with a history of asthma, Bartholin cysts, congenital absence of 1 kidney, presenting to the ED with multiple areas of inflammation that she identifies as abscesses multiple areas of her body noted over the last several months. These areas include her axilla, thighs, groin, right ear.  She has had multiple I&Ds. She also notes fatigue and overall feeling poorly for several months. She inquires about generalized work-up for the above complaints.  She specifically asks about things like lupus. She has just moved to the area from [place]. Towards the end of the interview, she also asks for STD testing.    She denies fever/chills, chest pain, shortness of breath, neurologic deficits, specific abdominal discomfort, abnormal vaginal bleeding/discharge, urinary symptoms, or any other complaints.  Past Medical History:  Diagnosis Date   Asthma    winter related when sick   Bartholin cyst    Boil    Congenital absence of one kidney    Gestational diabetes mellitus, currently pregnant    Mental disorder    hx of bipolar   Mood swings     Patient Active Problem List   Diagnosis Date Noted   Polysubstance (including opioids) dependence w/o physiol dependence (HCC) 03/24/2015   Substance induced mood disorder (HCC) 03/24/2015   Severe manic bipolar 1 disorder with psychotic behavior (HCC) 03/22/2015   Opioid abuse with opioid-induced mood disorder (HCC) 03/22/2015   Cannabis abuse 03/22/2015   Mood disorder in conditions classified elsewhere    Leukocytosis, unspecified 03/24/2013   Laceration of vaginal wall or sulcus without perineal laceration during delivery 03/24/2013   NSVD (normal spontaneous vaginal delivery) 03/23/2013    Gestational diabetes 03/21/2013   First trimester pregnancy 08/28/2012   Tinea corporis 08/28/2012   Bipolar 1 disorder (HCC) 08/28/2012    Past Surgical History:  Procedure Laterality Date   ADENOIDECTOMY     EXAMINATION UNDER ANESTHESIA N/A 03/30/2013   Procedure: EXAM UNDER ANESTHESIA;  Surgeon: Purcell Nails, MD;  Location: [facility];  Service: Gynecology;  Laterality: N/A;   NEPHRECTOMY  1991   left    tonsillectomy       OB History     Gravida  1   Para  1   Term  1   Preterm      AB      Living  1      SAB      IAB      Ectopic      Multiple      Live Births  1           Family History  Problem Relation Age of Onset   Kidney disease Maternal Grandmother    Cancer Maternal Grandmother    Diabetes Maternal Grandfather     Social History   Tobacco Use   Smoking status: Former    Pack years: 0.00    Types: Cigarettes   Smokeless tobacco: Never  Substance Use Topics   Alcohol use: No   Drug use: Not Currently    Comment: heroin    Home Medications Prior to Admission medications   Medication Sig Start Date End Date Taking? Authorizing Provider  doxycycline (VIBRAMYCIN) 100 MG capsule  Take 1 capsule (100 mg total) by mouth 2 (two) times daily. 07/31/20  Yes Norely Schlick C, PA-C  metroNIDAZOLE (FLAGYL) 500 MG tablet Take 1 tablet (500 mg total) by mouth 2 (two) times daily. 07/31/20  Yes Efstathios Sawin C, PA-C    Allergies    Patient has no known allergies.  Review of Systems   Review of Systems  Constitutional:  Positive for fatigue. Negative for chills, diaphoresis and fever.  Respiratory:  Negative for shortness of breath.   Cardiovascular:  Negative for chest pain.  Gastrointestinal:  Negative for abdominal distention, abdominal pain, diarrhea, nausea and vomiting.  Skin:  Positive for color change. Negative for wound.  Neurological:  Negative for dizziness, syncope, weakness, numbness and headaches.  All other systems reviewed and are  negative.  Physical Exam Updated Vital Signs BP (!) 144/103 (BP Location: Left Arm)   Pulse (!) 127   Temp 98.8 F (37.1 C) (Oral)   Resp 20   Ht 5\' 7"  (1.702 m)   Wt 103 kg   LMP 06/15/2020   SpO2 99%   BMI 35.55 kg/m   Physical Exam Vitals and nursing note reviewed.  Constitutional:      General: She is not in acute distress.    Appearance: She is well-developed. She is not diaphoretic.  HENT:     Head: Normocephalic and atraumatic.     Ears:     Comments: Area of tender swelling without noted fluctuance to the right lower earlobe.    Mouth/Throat:     Mouth: Mucous membranes are moist.     Pharynx: Oropharynx is clear.  Eyes:     Conjunctiva/sclera: Conjunctivae normal.  Cardiovascular:     Rate and Rhythm: Normal rate and regular rhythm.     Pulses: Normal pulses.          Radial pulses are 2+ on the right side and 2+ on the left side.       Posterior tibial pulses are 2+ on the right side and 2+ on the left side.     Heart sounds: Normal heart sounds.     Comments: Tactile temperature in the extremities appropriate and equal bilaterally. Not tachycardic on my evaluation. Pulmonary:     Effort: Pulmonary effort is normal. No respiratory distress.     Breath sounds: Normal breath sounds.  Abdominal:     Palpations: Abdomen is soft.     Tenderness: There is no abdominal tenderness. There is no guarding.  Musculoskeletal:     Cervical back: Neck supple.     Right lower leg: No edema.     Left lower leg: No edema.  Lymphadenopathy:     Cervical: No cervical adenopathy.  Skin:    General: Skin is warm and dry.     Comments: Small areas of swelling, tenderness, and mild erythema in the right axilla, left inner thigh.  No specific lymphadenopathy noted.  Neurological:     Mental Status: She is alert.  Psychiatric:        Mood and Affect: Mood and affect normal.        Speech: Speech normal.        Behavior: Behavior normal.    ED Results / Procedures /  Treatments   Labs (all labs ordered are listed, but only abnormal results are displayed) Labs Reviewed  WET PREP, GENITAL - Abnormal; Notable for the following components:      Result Value   Trich, Wet Prep PRESENT (*)  WBC, Wet Prep HPF POC MANY (*)    All other components within normal limits  RPR  HIV ANTIBODY (ROUTINE TESTING W REFLEX)  PREGNANCY, URINE  GC/CHLAMYDIA PROBE AMP ([place]) NOT AT [facility]    EKG None  Radiology No results found.  Procedures Procedures   Medications Ordered in ED Medications  ibuprofen (ADVIL) tablet 600 mg (600 mg Oral Given 07/31/20 2130)  cefTRIAXone (ROCEPHIN) injection 500 mg (500 mg Intramuscular Given 07/31/20 2259)  lidocaine (PF) (XYLOCAINE) 1 % injection 1 mL (1 mL Other Given 07/31/20 2259)    ED Course  I have reviewed the triage vital signs and the nursing notes.  Pertinent labs & imaging results that were available during my care of the patient were reviewed by me and considered in my medical decision making (see chart for details).    MDM Rules/Calculators/A&P                          Patient presents with a couple complaints.   Patient is nontoxic appearing, afebrile, not tachycardic on my exam, not tachypneic, not hypotensive, maintains excellent SPO2 on room air, and is in no apparent distress.   I have reviewed the patient's chart to obtain more information.   She complains of multiple areas where she has concern for abscess.  On exam, I did not note any of these areas with definite fluctuance.  Due to the timeline, their location, physical exam findings, I think it appropriate for [place] to start a course of antibiotics. Ultimately, she will need to follow-up with a primary care provider for a likely more extensive work-up.  Wet prep positive for trichomonas.  This was discussed with the patient.  Due to concerns for other STDs, she was also treated with ceftriaxone.  Doxycycline prescribed for both possible STD  coverage as well as coverage of skin lesions. She was given resources for primary care as well as OB/GYN.  The patient was given instructions for home care as well as return precautions. Patient voices understanding of these instructions, accepts the plan, and is comfortable with discharge.   Vitals:   07/31/20 1936 07/31/20 1937 07/31/20 2217 07/31/20 2312  BP: (!) 144/103  139/87 129/85  Pulse: (!) 127  89 87  Resp: 20  19 17   Temp: 98.8 F (37.1 C)   98.5 F (36.9 C)  TempSrc: Oral   Oral  SpO2: 99%  99% 99%  Weight:  103 kg    Height:  5\' 7"  (1.702 m)       Final Clinical Impression(s) / ED Diagnoses Final diagnoses:  Skin mass  Trichomonas infection    Rx / DC Orders ED Discharge Orders          Ordered    doxycycline (VIBRAMYCIN) 100 MG capsule  2 times daily        07/31/20 2152    metroNIDAZOLE (FLAGYL) 500 MG tablet  2 times daily        07/31/20 2152             08/02/20, PA-C 08/01/20 1739    Anselm Pancoast, DO 08/04/20 1540

## 2020-07-31 NOTE — Discharge Instructions (Addendum)
  You have been tested for STDs.  The test for trichomonas was positive. Some of these results are still pending. Any abnormalities will be called to you. You have been empirically treated for gonorrhea and chlamydia. This does not mean you necessarily have these diseases, treatment is precautionary. Be sure to follow safe sex practices, including monogamy and/or condom use. All sexual partners must also be notified and treated.  Any future STD testing or treatment should be performed at the [facility] department or primary care office. For the treatment to be fully effective, avoid all sexual contact for at least 2 weeks after medication administration. Otherwise, you will infect your partner(s) and/or risk reinfecting yourself. Should symptoms fail to improve within 1 week, follow up with a primary care provider or go to the [facility].  The doxycycline will address both the possible skin infection as well as the possible STD.

## 2020-08-01 LAB — SYPHILIS: RPR W/REFLEX TO RPR TITER AND TREPONEMAL ANTIBODIES, TRADITIONAL SCREENING AND DIAGNOSIS ALGORITHM: RPR Ser Ql: NONREACTIVE

## 2020-08-01 LAB — GC/CHLAMYDIA PROBE AMP (~~LOC~~) NOT AT ARMC
Chlamydia: NEGATIVE
Comment: NEGATIVE
Comment: NORMAL
Neisseria Gonorrhea: NEGATIVE

## 2020-08-01 LAB — HIV ANTIBODY (ROUTINE TESTING W REFLEX): HIV Screen 4th Generation wRfx: NONREACTIVE

## 2020-09-04 ENCOUNTER — Emergency Department (HOSPITAL_COMMUNITY)
Admission: EM | Admit: 2020-09-04 | Discharge: 2020-09-04 | Discharge status: Against Medical Advice | Payer: Self-pay | Attending: Emergency Medicine | Admitting: Emergency Medicine

## 2020-09-04 ENCOUNTER — Emergency Department (HOSPITAL_COMMUNITY): Payer: Self-pay

## 2020-09-04 DIAGNOSIS — F1510 Other stimulant abuse, uncomplicated: Secondary | ICD-10-CM | POA: Insufficient documentation

## 2020-09-04 DIAGNOSIS — R4182 Altered mental status, unspecified: Secondary | ICD-10-CM | POA: Insufficient documentation

## 2020-09-04 DIAGNOSIS — R4189 Other symptoms and signs involving cognitive functions and awareness: Secondary | ICD-10-CM

## 2020-09-04 DIAGNOSIS — D72829 Elevated white blood cell count, unspecified: Secondary | ICD-10-CM | POA: Insufficient documentation

## 2020-09-04 DIAGNOSIS — R55 Syncope and collapse: Secondary | ICD-10-CM | POA: Insufficient documentation

## 2020-09-04 LAB — CBC WITH DIFFERENTIAL/PLATELET
Abs Immature Granulocytes: 0.04 10*3/uL (ref 0.00–0.07)
Basophils Absolute: 0.1 10*3/uL (ref 0.0–0.1)
Basophils Relative: 0 %
Eosinophils Absolute: 0.1 10*3/uL (ref 0.0–0.5)
Eosinophils Relative: 1 %
HCT: 41 % (ref 36.0–46.0)
Hemoglobin: 12.8 g/dL (ref 12.0–15.0)
Immature Granulocytes: 0 %
Lymphocytes Relative: 38 %
Lymphs Abs: 6 10*3/uL — ABNORMAL HIGH (ref 0.7–4.0)
MCH: 26.2 pg (ref 26.0–34.0)
MCHC: 31.2 g/dL (ref 30.0–36.0)
MCV: 83.8 fL (ref 80.0–100.0)
Monocytes Absolute: 0.6 10*3/uL (ref 0.1–1.0)
Monocytes Relative: 4 %
Neutro Abs: 9.2 10*3/uL — ABNORMAL HIGH (ref 1.7–7.7)
Neutrophils Relative %: 57 %
Platelets: 470 10*3/uL — ABNORMAL HIGH (ref 150–400)
RBC: 4.89 MIL/uL (ref 3.87–5.11)
RDW: 13.7 % (ref 11.5–15.5)
WBC: 16 10*3/uL — ABNORMAL HIGH (ref 4.0–10.5)
nRBC: 0 % (ref 0.0–0.2)

## 2020-09-04 LAB — COMPREHENSIVE METABOLIC PANEL
ALT: 15 U/L (ref 0–44)
AST: 18 U/L (ref 15–41)
Albumin: 4.3 g/dL (ref 3.5–5.0)
Alkaline Phosphatase: 67 U/L (ref 38–126)
Anion gap: 10 (ref 5–15)
BUN: 11 mg/dL (ref 6–20)
CO2: 21 mmol/L — ABNORMAL LOW (ref 22–32)
Calcium: 9.4 mg/dL (ref 8.9–10.3)
Chloride: 109 mmol/L (ref 98–111)
Creatinine, Ser: 0.93 mg/dL (ref 0.44–1.00)
GFR, Estimated: >60 mL/min (ref >60)
Glucose, Bld: 198 mg/dL — ABNORMAL HIGH (ref 70–99)
Potassium: 3.8 mmol/L (ref 3.5–5.1)
Sodium: 140 mmol/L (ref 135–145)
Total Bilirubin: 0.6 mg/dL (ref 0.3–1.2)
Total Protein: 7.4 g/dL (ref 6.5–8.1)

## 2020-09-04 LAB — CBG MONITORING, ED: Glucose-Capillary: 196 mg/dL — ABNORMAL HIGH (ref 70–99)

## 2020-09-04 MED ORDER — LACTATED RINGERS IV BOLUS
1000.0000 mL | Freq: Once | INTRAVENOUS | Status: AC
  Administered 2020-09-04: 1000 mL via INTRAVENOUS

## 2020-09-04 NOTE — ED Notes (Addendum)
Pt noted to wander outside room with blood coming from former IV site that was discontinued by patient. ED nurse assisted pt back to room and notified this RN of the same. This RN at bedside to speak with pt, pt appeared anxious, stating she had to leave and go find her car. This RN explained that pt is still under evaluation and not ready for discharge. Pt continued to express desire to leave, and this RN notified ED provider of same. Provider at bedside to discuss plan of care, pt actively expressing will to leave. Provider discussed risks of leaving at this time, against medical advice; pt expressed understanding.  AMA signed by patient and witnessed by this RN.

## 2020-09-04 NOTE — ED Triage Notes (Signed)
Pt brought to room from lobby for unresponsiveness. Pt drowsy, alert when brought to room.

## 2020-09-04 NOTE — Discharge Instructions (Addendum)
[place] law prevents people with seizures or fainting from driving or operating dangerous machinery until they are free of seizures or fainting for 6 months.    Substance Abuse Treatment Programs  Intensive Outpatient Programs [facility]     [address]      [place], [place]                   [phone]       [facility] [address] [place] [address] [place], [place] [phone]  [facility]     (Inpatient and outpatient)     [address]           [phone]    [facility] [phone] (Suboxone and Methadone)  [address]      [place], [place] [address]      [phone]       [address] [place], [place] [phone]  [facility] (Outpatient/Inpatient, [employer])    (insurance only) [phone]             [facility] (Groups & Residential) [place], [place] [phone]     [facility]     [address]     [place], [place]      [phone]       [facility] (for caregivers and family) [phone] [address] [place], [place] [phone]      Residential Treatment Programs [facility]      [address] [place], [place] [address]  [phone]       [facility] [address], [place], [place] [address] [phone]  [facility] [place]        [phone]       [facility] [phone]  [facility] ([facility]             [address]                                         [place], [place]                                                [phone] or [phone]                               [facility] of [place] [address] [place], [address] [phone]  [facility]    [address]      [place], [place]     [phone]       [facility] [address] [place], [place] [phone]  [facility] [facility]   [address]  [place], [place] [address]     [phone]      Admissions: 8am-3pm M-F  [facility] ([facility]) [address] [place], [place] [phone]  [facility]: Residential Program [phone] Days)   [place], [place]      [phone] or [phone]     [facility]: [facility] [place], [place] (Walk in Hours over the weekend or by referral)  [facility] [address] [place], [place], [place] [address] [phone]  Crisis Mobile: Therapeutic  Alternatives:  [phone] (for crisis response 24 hours a day) [facility] Hotline:      [phone] Outpatient Psychiatry and Counseling  [facility]: Mobile Crisis Management 24 hours:  [phone]  [facility] of the [employer] sliding scale fee and walk in schedule: M-F 8am-12pm/1pm-3pm [address]  [place], [place] [address] [phone]  [facility] [address] [place], [place] [address] [phone]  [facility] ([facility] [employer])- new patient walk-in appointments available Monday - Friday 8am -3pm.          [address] [place], [place] [address] [phone] or crisis line- [phone]  [facility]/ Intensive Outpatient Therapy Program [address] [place], [place] [address] [phone]  [facility]                  Crisis Services      [phone] [address]     [place], [place] [address]                 [facility]   [facility] [phone]. [address] [place], [place] [address]   [employer] [facility]          [address] [place]  [place], [place] [address]       [phone]  [facility] [address] [place], [place] [address] [phone]  [facility]    [address]    [place], [place]  [address]     [phone]       Andee Poles, MD     [address]     [place] [address]     [phone]       [facility] [address] [place] [address]  [facility]     [address] [place], [place]      [phone]       [facility] Eulogio Ditch, MD [address] [place], [place] [address] [phone]  [facility]     [address]     [place], [place] [address]     [phone]       [facility] [address] [place], [place] [address] [phone] Resources for Temporary Residential Assistance/Crisis Centers  [facility] CENTERS [facility]) M-F 8am-3pm   [address] [place], [place] [address]   [phone] Services include: laundry, barbering, support groups, case management, phone  & computer access, showers, AA/NA mtgs, mental health/substance abuse nurse, job skills class, disability information, VA assistance, spiritual classes, etc.   HOMELESS SHELTERS  [facility]     [facility]   [address] [place]     [phone]              [employer] (women and children)       [address] [place], [place] [address] [phone] [email or web address] for application and process Application Required  [facility] [employer] [facility]   [address]    [place] [address]     [phone]                    [facility] [place] [address] [place], [place] [address] [phone] application appt.) Application Required  [employer] (women only)    [address]     [place], [place] [address]     [phone]      Intake starts 6pm daily Need valid ID, SSC, & Police report [employer] [address] [place], [place] [phone] Application Required  [employer] (men only)     [address]       [place], [place]     [phone]       [facility] (Pregnant women only) [address] [place], [place] [phone]  [facility]      [address]      [place], [place] [address]     [phone]             [facility] [address] [place], [place] [phone] 90 day commitment/SA/Application process  [facility](men only)     [address]     [place], [place]     [phone]       Check-in at [facility] [address] [place], [place] [address] [phone] Men/Women/Women and Children must be there by 7 pm  [facility] [place], [place] [phone]

## 2020-09-04 NOTE — ED Provider Notes (Signed)
[facility] EMERGENCY DEPARTMENT Provider Note   CSN: [id number] Arrival date & time: 09/04/20  1855     History Chief Complaint  Patient presents with   Altered Mental Status    Emily David is a 30 y.o. female that is presenting from the lobby for unresponsiveness.  Patient became drowsy and alert when brought back to the ED room.  Patient endorses recent meth use and states that after she smoked meth that she had lost consciousness. She does not remember what happened after using meth. She denies any other drug use. She denies any chest pain or shortness of breath. She denies any alcohol use. She does not know where she is and is diaphoretic.   She denies any headache, neck stiffness, chest pain, shortness of breath, abdominal pain, nausea, vomiting, diarrhea, numbness or weakness. Patient denies any suicidal ideation, homicidal ideation, audio or visual hallucinations.    Altered Mental Status Presenting symptoms: unresponsiveness   Most recent episode:  Today Episode history:  Single Context: drug use   Associated symptoms: no abdominal pain, normal movement, no agitation, no bladder incontinence, no decreased appetite, no depression, no difficulty breathing, no eye deviation, no fever, no hallucinations, no headaches, no light-headedness, no nausea, no palpitations, no rash, no seizures, no slurred speech, no suicidal behavior, no visual change, no vomiting and no weakness       No past medical history on file.  There are no problems to display for this patient.      OB History   No obstetric history on file.     No family history on file.     Home Medications Prior to Admission medications   Not on File    Allergies    Patient has no allergy information on record.  Review of Systems   Review of Systems  Constitutional:  Negative for activity change, appetite change, chills, decreased appetite, diaphoresis, fatigue and fever.  HENT:   Negative for congestion, drooling, ear pain, rhinorrhea, sneezing, sore throat and voice change.   Eyes:  Negative for pain, itching and visual disturbance.  Respiratory:  Negative for cough, shortness of breath and wheezing.   Cardiovascular:  Negative for chest pain and palpitations.  Gastrointestinal:  Negative for abdominal distention, abdominal pain, constipation, diarrhea, nausea and vomiting.  Endocrine: Negative for polydipsia and polyuria.  Genitourinary:  Negative for bladder incontinence, difficulty urinating, dysuria, flank pain, frequency and hematuria.  Musculoskeletal:  Negative for arthralgias and back pain.  Skin:  Negative for color change and rash.  Neurological:  Negative for dizziness, seizures, syncope, facial asymmetry, speech difficulty, weakness, light-headedness, numbness and headaches.  Psychiatric/Behavioral:  Negative for agitation, behavioral problems, hallucinations, self-injury and suicidal ideas.   All other systems reviewed and are negative.  Physical Exam Updated Vital Signs BP (!) 133/93   Pulse (!) 134   Temp 98.2 F (36.8 C) (Tympanic)   Resp (!) 21   SpO2 91%   Physical Exam Vitals and nursing note reviewed.  Constitutional:      General: She is not in acute distress.    Appearance: She is well-developed. She is not ill-appearing.  HENT:     Head: Normocephalic and atraumatic.     Right Ear: External ear normal.     Left Ear: External ear normal.     Nose: Nose normal. No congestion.     Mouth/Throat:     Mouth: Mucous membranes are moist.  Eyes:     General: No visual field  deficit.    Extraocular Movements: Extraocular movements intact.     Conjunctiva/sclera: Conjunctivae normal.     Pupils: Pupils are equal, round, and reactive to light.  Cardiovascular:     Rate and Rhythm: Normal rate and regular rhythm.     Pulses: Normal pulses.     Heart sounds: Normal heart sounds. No murmur heard.   No friction rub.  Pulmonary:     Effort:  Pulmonary effort is normal. No respiratory distress.     Breath sounds: Normal breath sounds.  Abdominal:     General: There is no distension.     Palpations: Abdomen is soft.     Tenderness: There is no abdominal tenderness. There is no guarding or rebound.  Musculoskeletal:     Cervical back: Normal range of motion and neck supple.  Skin:    General: Skin is warm and dry.  Neurological:     General: No focal deficit present.     Mental Status: She is alert and oriented to person, place, and time. Mental status is at baseline.     Cranial Nerves: Cranial nerves are intact. No cranial nerve deficit, dysarthria or facial asymmetry.     Sensory: Sensation is intact. No sensory deficit.     Motor: Motor function is intact. No weakness.     Coordination: Coordination is intact. Coordination normal. Finger-Nose-Finger Test normal.     Gait: Gait normal.    ED Results / Procedures / Treatments   Labs (all labs ordered are listed, but only abnormal results are displayed) Labs Reviewed  CBC WITH DIFFERENTIAL/PLATELET - Abnormal; Notable for the following components:      Result Value   WBC 16.0 (*)    Platelets 470 (*)    Neutro Abs 9.2 (*)    Lymphs Abs 6.0 (*)    All other components within normal limits  COMPREHENSIVE METABOLIC PANEL - Abnormal; Notable for the following components:   CO2 21 (*)    Glucose, Bld 198 (*)    All other components within normal limits  CBG MONITORING, ED - Abnormal; Notable for the following components:   Glucose-Capillary 196 (*)    All other components within normal limits    EKG None  Radiology DG Chest Portable 1 View  Result Date: 09/04/2020 CLINICAL DATA:  Unresponsive, drug overdose EXAM: PORTABLE CHEST 1 VIEW COMPARISON:  03/21/2015 FINDINGS: The heart size and mediastinal contours are within normal limits. Both lungs are clear. The visualized skeletal structures are unremarkable. IMPRESSION: No active disease. Electronically Signed   By:  Helyn Numbers MD   On: 09/04/2020 19:44    Procedures Procedures   Medications Ordered in ED Medications  lactated ringers bolus 1,000 mL (0 mLs Intravenous Stopped 09/04/20 1945)    ED Course  I have reviewed the triage vital signs and the nursing notes.  Pertinent labs & imaging results that were available during my care of the patient were reviewed by me and considered in my medical decision making (see chart for details).    MDM Rules/Calculators/A&P                         Critical care - 15 minutes  Patient is a 30 year old female that presented after being found unresponsive. Patient endorse meth use prior to loss of consciousness. She does not remember the incident. She was brought by a friend. She became alert when she was brought back to the room. She  is alert and oriented x4. She has pinpoint pupils. We believe her loss of consciousness is secondary to drug use. Concern that the meth she used may have had an opioid component. Her neurological exam is notable for no focal neuro deficits. Patient is alert and oriented x4. Her POCT was 196. Her EKG was notable for SVT. Her CBC had a leukocytosis. Her CMP was unremarkable. Her CXR showed no aspiration. She was given IV fluid. However, patient stated that she wanted to leave AMA. Dr. Rhunette Croft discussed with her the risk and benefits of leaving against medical advice. Prior to leaving AMA patient had capacity to make decisions. The risks and benefits were discussed with her about leaving AMA. The risk of possibly death and worsening of symptoms were discussed with the patient if she left AMA. She stated that she had to leave to go get her car. Patient understands the risks and benefit and wants to leave AMA. Patient will return to the ED if she wants further evaluation. Final Clinical Impression(s) / ED Diagnoses Final diagnoses:  Methamphetamine abuse (HCC)  Syncope and collapse  Unresponsiveness    Rx / DC Orders ED Discharge  Orders     None        Lottie Dawson, MD 09/05/20 Rolland Porter, MD 09/05/20 [phone]

## 2021-11-23 IMAGING — DX DG CHEST 1V PORT
1 series · 1 of 1 positions shown · non-contrast
Comparison: 03/21/2015

CLINICAL DATA: Unresponsive, drug overdose

EXAM:
PORTABLE CHEST 1 VIEW

[chest]
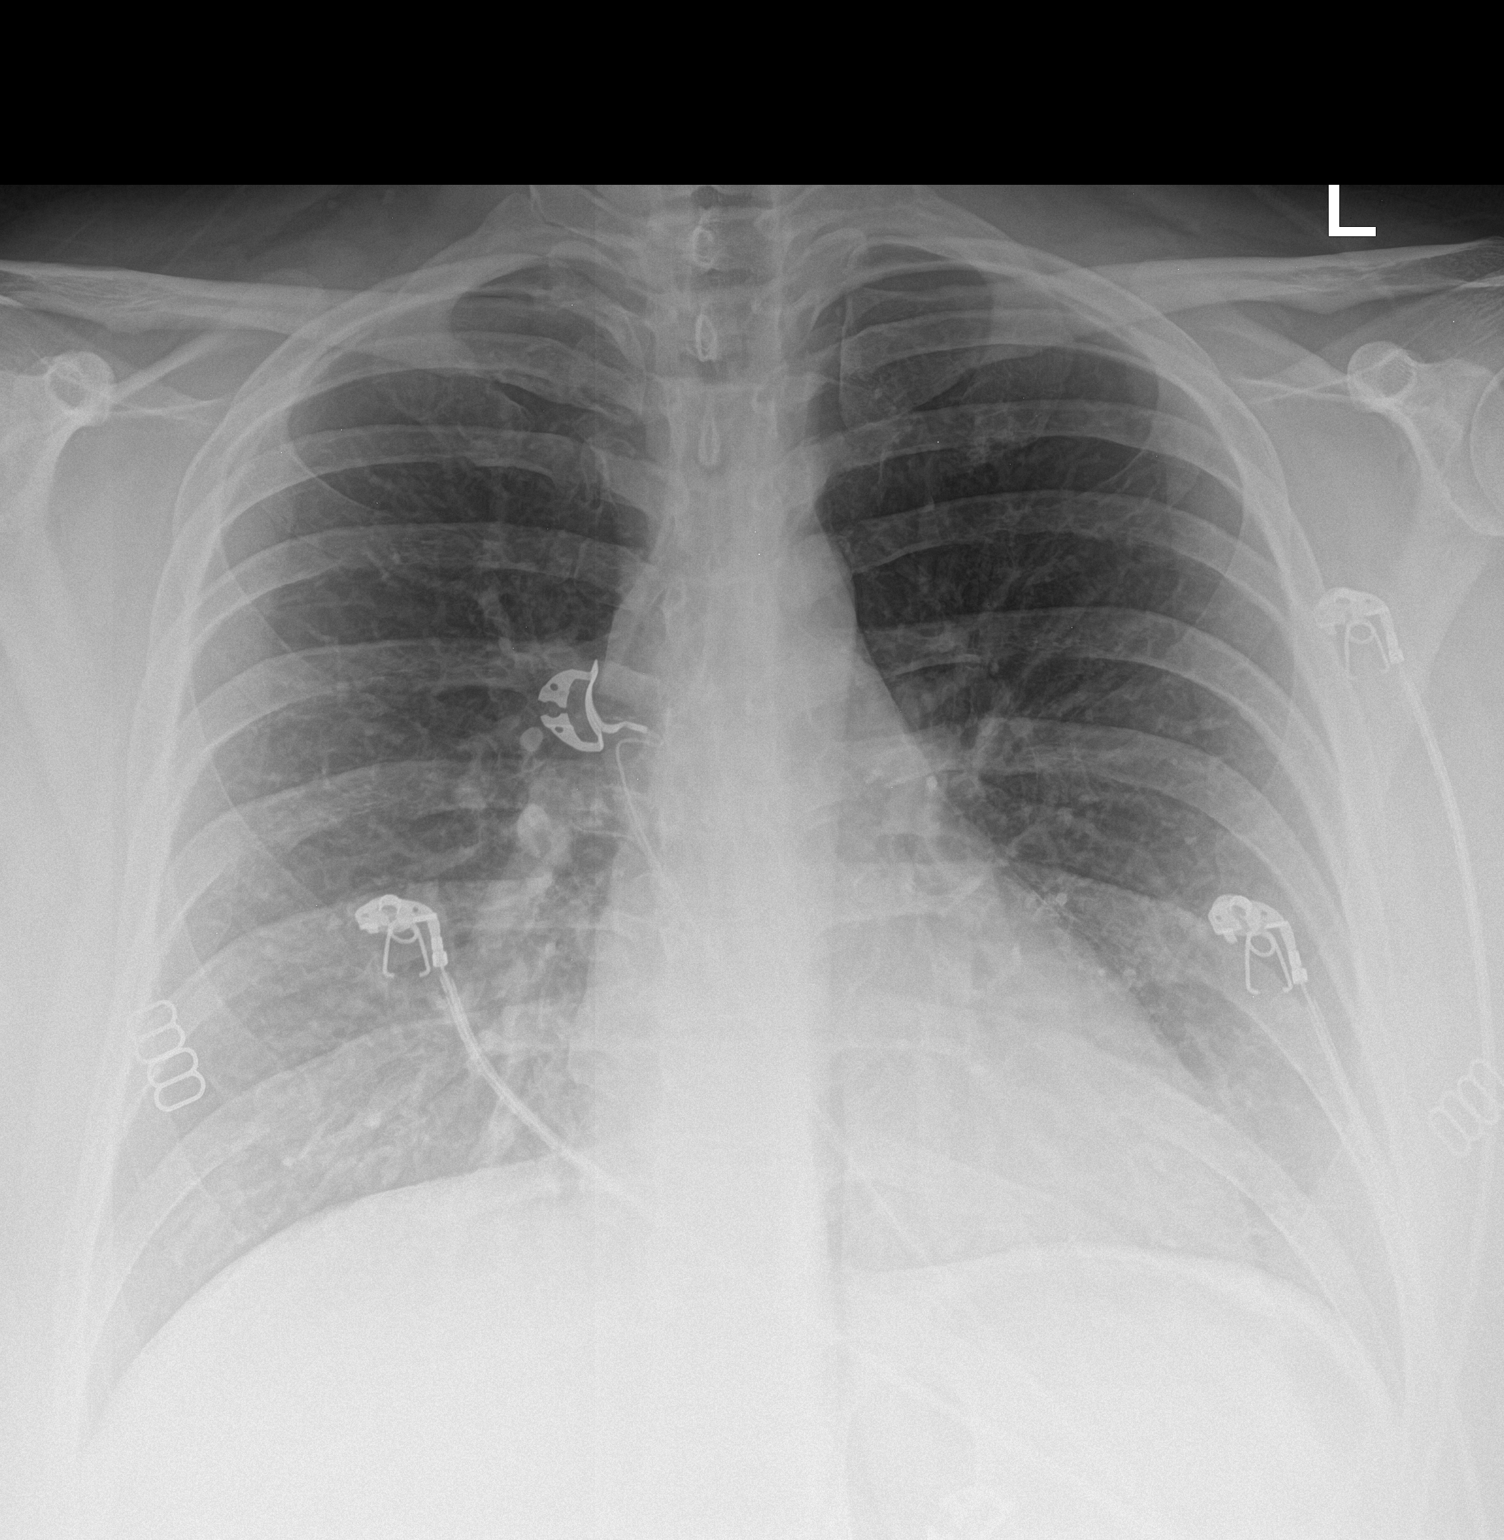

[1 of 1 positions shown; findings below may reference images not displayed]

FINDINGS: The heart size and mediastinal contours are within normal limits.
Both lungs are clear. The visualized skeletal structures are
unremarkable.
IMPRESSION: No active disease.
